# Patient Record
Sex: Male | Born: 1935 | Race: White | Hispanic: No | Marital: Married | Smoking: Former smoker
Health system: Southern US, Community
[De-identification: ages and names within clinical notes are randomized; demographics above are authoritative.]

## PROBLEM LIST (undated history)

## (undated) DIAGNOSIS — I350 Nonrheumatic aortic (valve) stenosis: Secondary | ICD-10-CM

## (undated) DIAGNOSIS — I2510 Atherosclerotic heart disease of native coronary artery without angina pectoris: Secondary | ICD-10-CM

## (undated) DIAGNOSIS — IMO0001 Reserved for inherently not codable concepts without codable children: Secondary | ICD-10-CM

## (undated) DIAGNOSIS — I10 Essential (primary) hypertension: Secondary | ICD-10-CM

## (undated) DIAGNOSIS — Z87442 Personal history of urinary calculi: Secondary | ICD-10-CM

## (undated) DIAGNOSIS — Z531 Procedure and treatment not carried out because of patient's decision for reasons of belief and group pressure: Secondary | ICD-10-CM

## (undated) DIAGNOSIS — D638 Anemia in other chronic diseases classified elsewhere: Secondary | ICD-10-CM

## (undated) DIAGNOSIS — I4510 Unspecified right bundle-branch block: Secondary | ICD-10-CM

## (undated) DIAGNOSIS — Z950 Presence of cardiac pacemaker: Secondary | ICD-10-CM

## (undated) HISTORY — DX: Reserved for inherently not codable concepts without codable children: IMO0001

## (undated) HISTORY — DX: Nonrheumatic aortic (valve) stenosis: I35.0

## (undated) HISTORY — DX: Unspecified right bundle-branch block: I45.10

## (undated) HISTORY — DX: Essential (primary) hypertension: I10

## (undated) HISTORY — DX: Presence of cardiac pacemaker: Z95.0

## (undated) HISTORY — DX: Procedure and treatment not carried out because of patient's decision for reasons of belief and group pressure: Z53.1

## (undated) HISTORY — DX: Anemia in other chronic diseases classified elsewhere: D63.8

## (undated) HISTORY — DX: Personal history of urinary calculi: Z87.442

## (undated) HISTORY — DX: Atherosclerotic heart disease of native coronary artery without angina pectoris: I25.10

---

## 1963-12-24 HISTORY — PX: DENTAL RESTORATION/EXTRACTION WITH X-RAY: SHX5796

## 1991-12-24 HISTORY — PX: CHOLECYSTECTOMY: SHX55

## 2002-12-23 DIAGNOSIS — I2510 Atherosclerotic heart disease of native coronary artery without angina pectoris: Secondary | ICD-10-CM

## 2002-12-23 DIAGNOSIS — I350 Nonrheumatic aortic (valve) stenosis: Secondary | ICD-10-CM

## 2002-12-23 HISTORY — DX: Atherosclerotic heart disease of native coronary artery without angina pectoris: I25.10

## 2002-12-23 HISTORY — PX: AORTIC VALVE REPLACEMENT: SHX41

## 2002-12-23 HISTORY — PX: CORONARY ARTERY BYPASS GRAFT: SHX141

## 2002-12-23 HISTORY — DX: Nonrheumatic aortic (valve) stenosis: I35.0

## 2003-08-26 ENCOUNTER — Encounter: Payer: Self-pay | Admitting: Cardiology

## 2003-08-26 ENCOUNTER — Inpatient Hospital Stay (HOSPITAL_COMMUNITY): Admission: EM | Admit: 2003-08-26 | Discharge: 2003-09-02 | Payer: Self-pay | Admitting: Cardiology

## 2003-08-27 ENCOUNTER — Encounter: Payer: Self-pay | Admitting: Cardiology

## 2003-08-31 ENCOUNTER — Encounter (INDEPENDENT_AMBULATORY_CARE_PROVIDER_SITE_OTHER): Payer: Self-pay | Admitting: Cardiology

## 2003-09-22 ENCOUNTER — Encounter: Payer: Self-pay | Admitting: Cardiothoracic Surgery

## 2003-09-22 ENCOUNTER — Ambulatory Visit (HOSPITAL_COMMUNITY): Admission: RE | Admit: 2003-09-22 | Discharge: 2003-09-22 | Payer: Self-pay | Admitting: Cardiothoracic Surgery

## 2003-09-26 ENCOUNTER — Encounter: Payer: Self-pay | Admitting: Cardiothoracic Surgery

## 2003-09-26 ENCOUNTER — Inpatient Hospital Stay (HOSPITAL_COMMUNITY): Admission: RE | Admit: 2003-09-26 | Discharge: 2003-10-05 | Payer: Self-pay | Admitting: Cardiothoracic Surgery

## 2003-09-26 ENCOUNTER — Encounter (INDEPENDENT_AMBULATORY_CARE_PROVIDER_SITE_OTHER): Payer: Self-pay | Admitting: *Deleted

## 2003-09-27 ENCOUNTER — Encounter: Payer: Self-pay | Admitting: Cardiothoracic Surgery

## 2003-09-28 ENCOUNTER — Encounter: Payer: Self-pay | Admitting: Cardiothoracic Surgery

## 2003-09-29 ENCOUNTER — Encounter: Payer: Self-pay | Admitting: Cardiothoracic Surgery

## 2003-10-21 ENCOUNTER — Encounter: Admission: RE | Admit: 2003-10-21 | Discharge: 2003-10-21 | Payer: Self-pay | Admitting: Cardiothoracic Surgery

## 2005-12-23 HISTORY — PX: CORONARY ANGIOPLASTY WITH STENT PLACEMENT: SHX49

## 2005-12-23 HISTORY — PX: PACEMAKER INSERTION: SHX728

## 2006-05-31 ENCOUNTER — Inpatient Hospital Stay (HOSPITAL_COMMUNITY): Admission: EM | Admit: 2006-05-31 | Discharge: 2006-06-04 | Payer: Self-pay | Admitting: Cardiology

## 2006-06-02 ENCOUNTER — Encounter (INDEPENDENT_AMBULATORY_CARE_PROVIDER_SITE_OTHER): Payer: Self-pay | Admitting: *Deleted

## 2006-07-09 ENCOUNTER — Inpatient Hospital Stay (HOSPITAL_COMMUNITY): Admission: AD | Admit: 2006-07-09 | Discharge: 2006-07-14 | Payer: Self-pay | Admitting: Cardiovascular Disease

## 2006-07-21 ENCOUNTER — Inpatient Hospital Stay (HOSPITAL_COMMUNITY): Admission: AD | Admit: 2006-07-21 | Discharge: 2006-07-22 | Payer: Self-pay | Admitting: *Deleted

## 2006-09-18 ENCOUNTER — Inpatient Hospital Stay (HOSPITAL_COMMUNITY): Admission: AD | Admit: 2006-09-18 | Discharge: 2006-09-19 | Payer: Self-pay | Admitting: Cardiovascular Disease

## 2008-04-09 ENCOUNTER — Inpatient Hospital Stay (HOSPITAL_COMMUNITY): Admission: EM | Admit: 2008-04-09 | Discharge: 2008-04-10 | Payer: Self-pay | Admitting: Cardiovascular Disease

## 2008-08-24 IMAGING — CR DG CHEST 2V
2 series · 2 of 2 positions shown · non-contrast
Comparison: 07/22/2006

CLINICAL DATA: Chest pain

CHEST - 2 VIEW

[w chest pa]
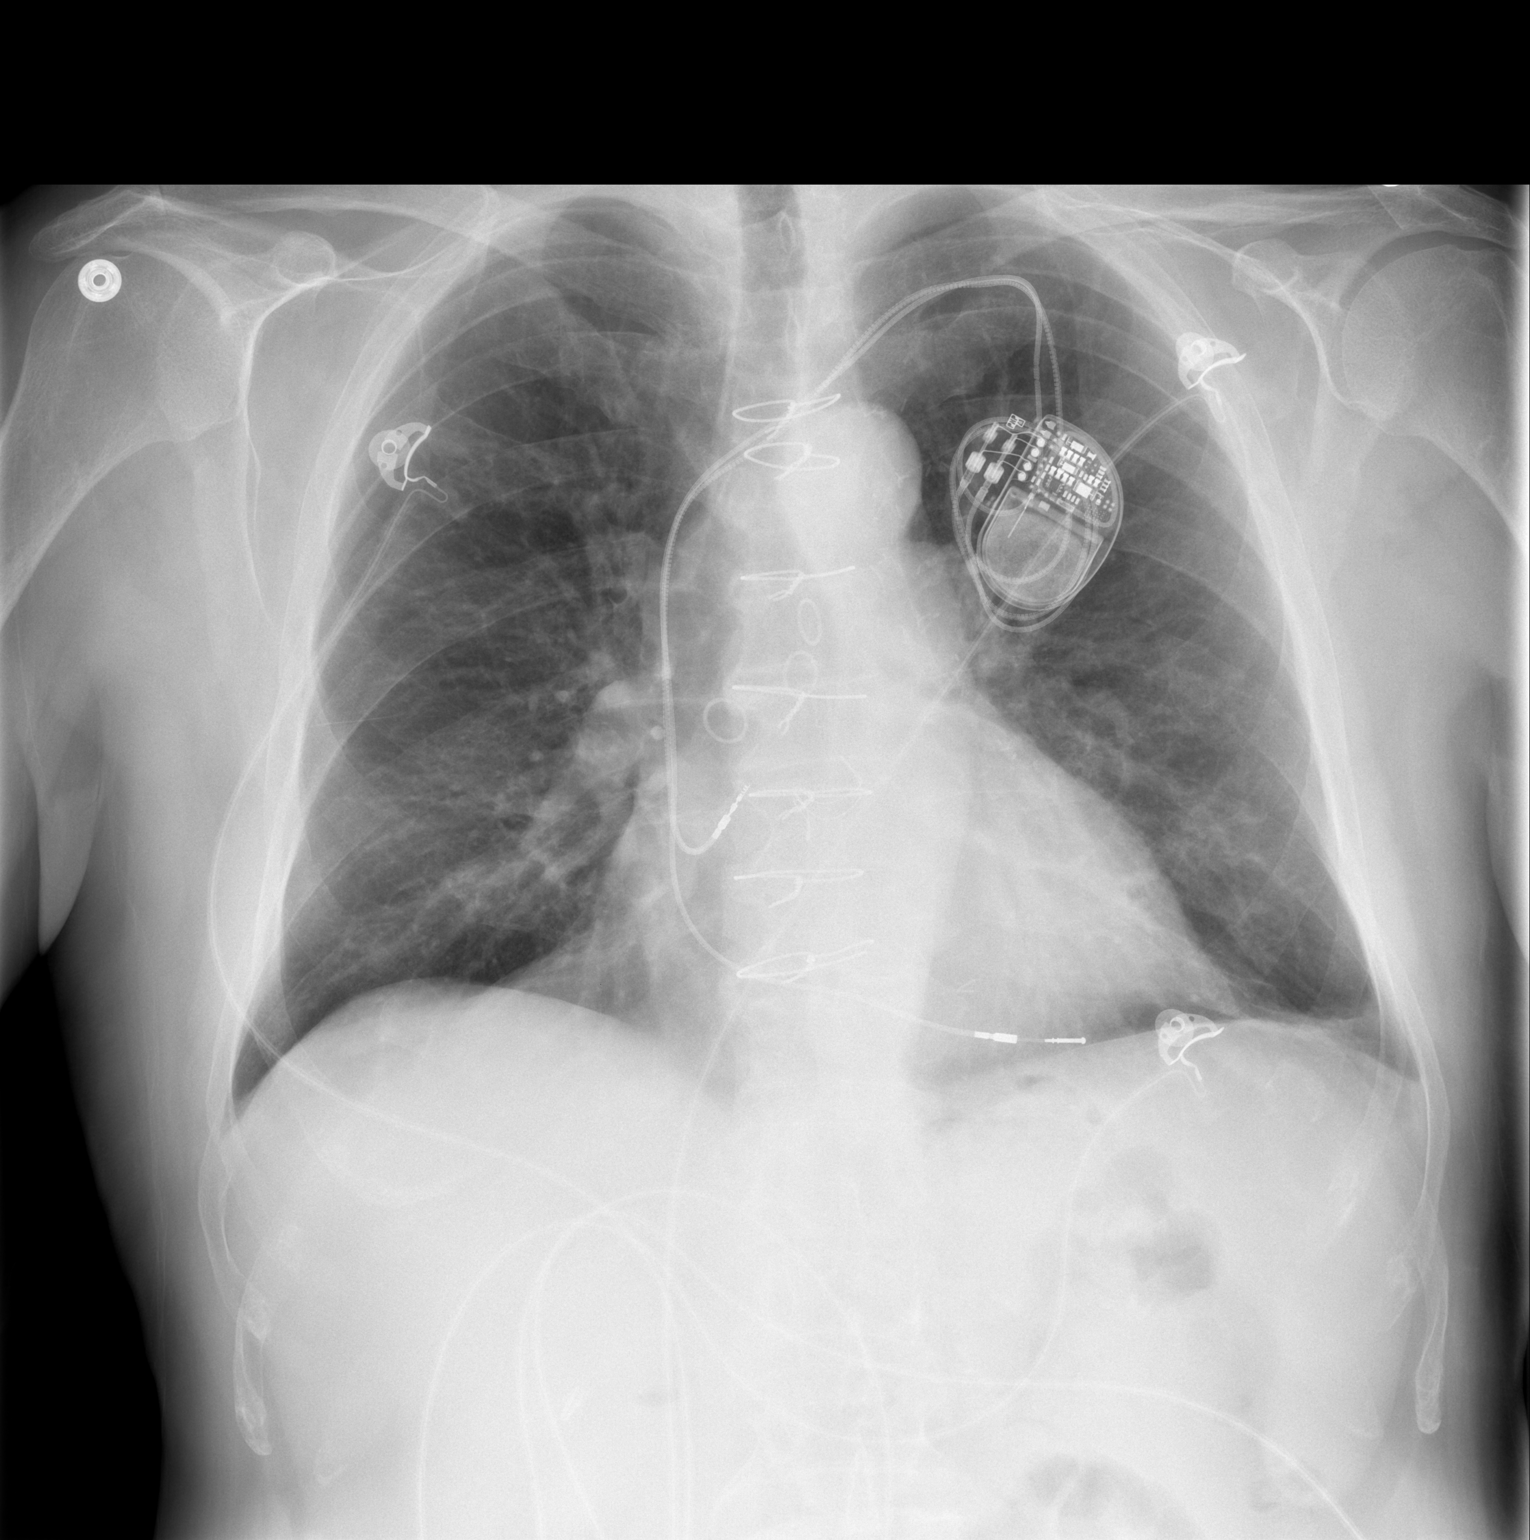

[w chest lat]
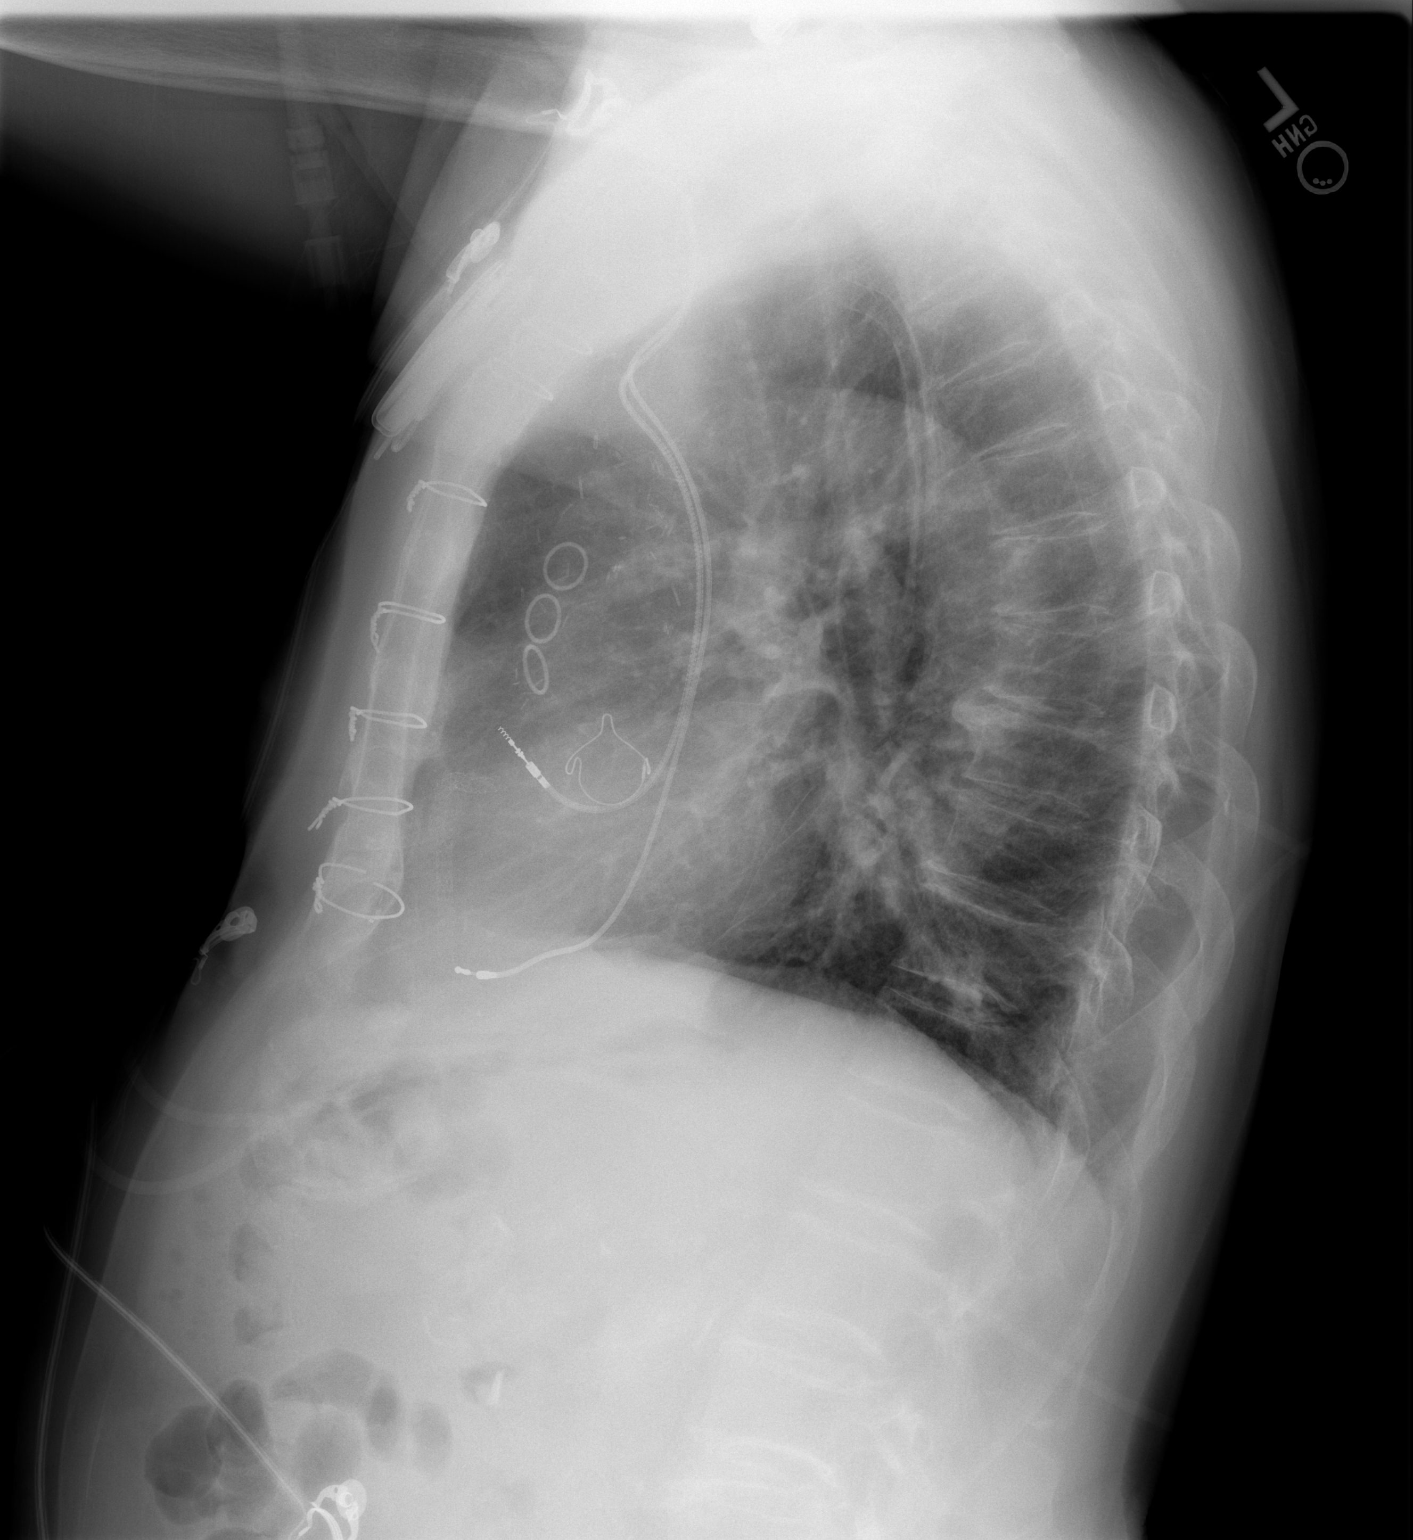

[2 of 2 positions shown; findings below may reference images not displayed]

FINDINGS: Cardiac pacer remains in satisfactory position.
Pulmonary interstitial densities are chronic.  Negative for
infiltrates edema or effusions.  Degenerative changes mid thoracic
spine
IMPRESSION: Stable chest

## 2011-05-10 NOTE — Discharge Summary (Signed)
NAMELYNNWOOD, BECKFORD NO.:  [id number]   MEDICAL RECORD NO.:  [id number]          PATIENT TYPE:  INP   LOCATION:  4703                         FACILITY:  [facility]   PHYSICIAN:  Nanetta Batty, M.D.   DATE OF BIRTH:  1924-02-09   DATE OF ADMISSION:  07/09/2006  DATE OF DISCHARGE:  07/14/2006                                 DISCHARGE SUMMARY   DISCHARGE DIAGNOSES:  1. Recurrent and stable angina.  2. Significant coronary artery disease with history of bypass grafting and      now occluded vein graft to the RCA, LIMA with a 95% ostial stenosis      failing medical therapy.  3. Failure of EECP treatment secondary to trauma of lower extremities.  4. Chronic right bundle-branch block.  5. Bradycardia with medication, beta-blocker and pain medication.  6. Hyperlipidemia.  7. Paroxysmal atrial fibrillation.  8. Hypertension.  9. Jehovah Witness refusing blood products.   DISCHARGE CONDITION:  Stable.   DISCHARGE MEDICATIONS:  1. Aspirin 325 mg daily.  2. Prilosec 20 mg daily.  3. Imdur 60 mg 1-1/2 tablets daily to equal 90 mg.  4. Lescol XL 80 mg daily.  5. Metoprolol 25 b.i.d.  6. Norvasc 10 mg daily.  7. Hyzaar 100/12.5 daily instead of the Cozaar.  8. Nitroglycerin as needed sublingual.  9. Darvocet-N 100 one to two every six hours as needed.  10.Stop furosemide and potassium.   DISCHARGE INSTRUCTIONS:  1. Low fat and low-salt diet.  2. Increase activity slowly.  3. See Dr. Alanda Amass July 22, 2006 at noon.   HISTORY OF PRESENT ILLNESS:  A 77 year old white married male patient of Dr.  Kandis Cocking was admitted to the hospital July 09, 2006 by Dr. Allyson Sabal after  presenting to [facility] with chest pain.   His history is complex and he has had history of coronary disease and  valvular heart disease with bypass grafting October of 2004 by Dr. Kathlee Nations  Trigt with a bovine aortic valve placed as well.  He also has hypertension,  dyslipidemia and statin  intolerance as well as PAF.  He is a Jehovah  Witness.   In June of this year, he was admitted with chest pain and subendocardial MI.  Cardiac cath revealed occluded vein graft to the RCA with a recannulized  proximal dominant branch.  His LIMA went to a small diagonal which had a 95%  ostial stenosis in the LAD with bypass.  He had a vein graft to the diagonal  OM.  Medical therapy was deemed appropriate at that time, he was discharged  home and began EECP treatments and he had two weeks of this when they were  discontinued because of trauma to his lower extremities.  On July 04, 2006,  he was admitted to [facility] with chest pain.  His enzymes  were negative, though they were trending upward.  He was transferred to [facility]  at that point for further eval.   OUTPATIENT MEDS:  1. Lopressor 25 b.i.d.  2. Aspirin 81.  3. Cozaar 100.  4. Isosorbide 60.  5. Lasix  40.  6. Potassium 10.   ALLERGIES:  AMIODARONE, KEFLEX AND PENICILLIN.   FAMILY HISTORY/SOCIAL HISTORY/ REVIEW OF SYSTEMS:  See H&P.   PHYSICAL EXAM AT DISCHARGE:  VITAL SIGNS:  Blood pressure initially had been  171/54 and then was 130/70, pulse 54.  GENERAL:  Alert oriented male.  LUNG:  Clear.  HEART:  Regular rate and rhythm.   LABORATORY DATA:  Hemoglobin 12.5, hematocrit 37.4, WBC 4.6, platelets 128.  At discharge, hemoglobin 11.6, hematocrit 33.8, WBC 3.9 and platelets 129.  Neutrophils 69, lymphs 42, monos 8, eo 1, baso 0.   Occult blood was negative.   Admit PT of 14.8, INR of 1.1.  On heparin, he was 0.73 for heparin level.   Chemistry:  Sodium 140, potassium 3.9, chloride 109, CO2 25, glucose 135,  BUN 16, creatinine 1.1, calcium 8.8, total protein 6.3, albumin 3.8, AST 28,  ALT 22, ALP 90, total bili was 3.5.  Glucose was only mildly elevated after  the initial admission at 100 to 101.   Cardiac enzymes were negative.  CKs were 53, 62 with the MBs 3.4 and 3.0,  but the troponin I was  elevated at 0.41 and 0.31.   TSH 1.144.  UA had a trace of hemoglobin, otherwise clear.   HOSPITAL COURSE:  Mr. Devine was admitted by Dr. Allyson Sabal after being  transferred from [facility] secondary to chest pain.  Initially, he  was still hypertensive and beta-blocker was increased, Lasix was  discontinued and patient was weaned off the nitroglycerin.  He has history  of intolerance to statins, but we are discharging him with Lescol to try  that.  Norvasc was also added for hypertension and angina.  He does have a  history of diastolic heart failure.  He was fairly stable until July 19, he  became bradycardic and had a decrease in consciousness and Dr. Jacinto Halim and the  rapid response team were called.  Patient's heart rate had dropped to 44,  morphine was discontinued and Darvocet was added instead.  Patient  eventually came around and had no further problems except the heart rate was  low.  His Lopressor was held and then restarted.  Patient did well.  Dr.  Cornelius Moras saw him and felt that CABG was not called for at this point and PTI  could be attempted, but it would be without surgical backup.  Patient  continued to improve with a stabilized heart rate, negative MI.   By July 23, he had no further chest pain.  Dr. Allyson Sabal discussed possibly  sending him to Dr. Lisabeth Register at [facility] for a second opinion.  He was going  to go home which he did and was to follow up with Dr. Alanda Amass in one week.      Darcella Gasman. Annie Paras, N.P.      Nanetta Batty, M.D.  Electronically Signed    LRI/MEDQ  D:  09/07/2006  T:  09/07/2006  Job:  [id number]   cc:   Kerin Perna, M.D.

## 2011-05-10 NOTE — Consult Note (Signed)
NAME:  [name], [name]                         ACCOUNT NO.:  [id number]   MEDICAL RECORD NO.:  [id number]                   PATIENT TYPE:  INP   LOCATION:  4707                                 FACILITY:  [facility]   PHYSICIAN:  Samul Dada, M.D.            DATE OF BIRTH:  06/07/1924   DATE OF CONSULTATION:  08/26/2003  DATE OF DISCHARGE:                                   CONSULTATION   REFERRING MEDICAL DOCTOR:  Dr. Dani Gobble.   HISTORY OF PRESENT ILLNESS:  [age]-year-old male, Jehovah's Witness,  asked to see in consultation for low platelet count.  He was admitted with  chest pain on August 25, 2003 in the [facility] ED.  He was diagnosed with  inferior wall MI and atrial fibrillation with rapid ventricular rate, now  converted to sinus rhythm.  He was transferred to [facility] for further  evaluation.  His platelets were 110,000 prior to transferring.  He was  started on heparin.  New labs on August 26, 2003 at [facility] revealed a  PTT of 47 and a PT of 15.3 with an INR of 1.3.  He was to have a planned  catheterization.  However, he mentioned that in 1995, after sedation, he was  given anti-thrombolytic treatment, and undergoing catheterization, he  experienced a large bleed, and almost died, (no records).  Per patient  report, he was evaluated by a hematologist in [place], Dr. Lambert Mody, two years ago  to follow up on his low platelet count versus MDS, but no bone marrow was  recommended at the time.  He failed to follow up.  He was seen again at the  emergency department by Dr. Lambert Mody, who raised the question of MDS versus  other blood dyscrasias, to be followed up and treated at [facility].  Before proceeding with catheterization, he was asked to be seen.   PAST MEDICAL HISTORY:  1. CAD, status post MI in 62 and 1995.  2. Hypertension.  3. History of positional vertigo.  4. History of hiatal hernia with stricture -- esophageal dysmotility.  5. Diabetes  mellitus, diet-controlled.  6. Hypercholesterolemia.  7. PAF in admission.   MEDICATIONS:  1. Hydrochlorothiazide 25 mg daily.  2. K-Dur 20 mEq daily.  3. Colace 200 mg daily.  4. Prilosec 20 mg daily.  5. Avapro 300 mg q.a.m.  6. Atenolol 50 mg q.a.m.  7. ECASA.   ALLERGIES:  LIPITOR, LOPID, BETA_______, PROCARDIA, PENICILLIN, VASOTEC,  MAXZIDE, AVAPRO, NIASPAN, CELEBREX and PRAVACHOL.   SURGERY:  1. Status post cholecystectomy.  2. Status post lithotripsy.  3. Status post cardiac catheterization, last one in 1995.   REVIEW OF SYSTEMS:  See HPI for significant positives.   FAMILY HISTORY:  Mother died of MI at 82; father died at 71 also of MI.  One  brother is alive with a history of blood problems at 72.  The patient's  brother does  not want to elaborate on what type of blood disease he has.   SOCIAL HISTORY:  The patient is married and has four children in good  health.  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  He denies any tobacco or alcohol  intake.  He is with his wife Liborio Nixon today, telephone number [phone], his  caretaker.   PHYSICAL EXAMINATION:  GENERAL:  [age]-year-old white male in no acute  distress, alert and oriented x3.  VITAL SIGNS:  Blood pressure 173/87, pulse 57, respirations 20, temperature  97.3, pulse oximetry 95% on 2 L.  HEENT:  Normocephalic, atraumatic.  PERRLA.  EOMI.  Oral mucosa intact.  NECK:  Neck supple.  No JVD, cervical or supraclavicular masses.  Bilateral  bruits audible.  CHEST:  Chest symmetrical on inspiration with no wheezes or rhonchi.  There  is a trace of rales audible.  AXILLAE:  No axillary masses.  CARDIOVASCULAR:  Regular rate and rhythm with a 2/6 systolic murmur.  No  rubs or gallops.  ABDOMEN:  Abdomen soft, nontender.  Bowel sounds x4.  No palpable spleen or  liver.  GU AND RECTAL:  Deferred.  EXTREMITIES:  No clubbing, cyanosis, or edema.  SKIN:  Skin with the presence of an area of ecchymosis or bruise in the   right side of his back, the patient denies any trauma to the area.  NEUROLOGIC:  Nonfocal.   LABORATORIES:  Hemoglobin 11.8, hematocrit 33.8, WBC 5.7, platelets 98,000;  normal differential; MCV 87.9, MCH 30.  PT 15.3, INR 1.3, PTT 47.  BNP 553.  CK positive.  Sodium 138, potassium 4.2, BUN 12, creatinine 0.9, glucose 95,  calcium 8.3.   ASSESSMENT AND PLAN:  Because the patient had a severe life-threatening  hemorrhage after cardiac catheterization in 1995 in [place], [place], his  condition poses the following problems:   1. Jehovah's Witness:  The patient will not accept major primary blood     components, whole blood, any cellular products or plasma.  If he has a     major bleed, a pack of red blood cells or platelets are out.  Products,     for example, intravenous immunoglobulin and activated factor VII, might     be acceptable; the family to confirm.  2. The patient's mild anemia and thrombocytopenia are of uncertain etiology.     It could be myelodysplastic syndromes, hypersplenism, or medicine     related.  It is unlikely that the patient has a bleeding disorder, even     with a platelet count of 100,000, but we will check for von Willebrand's     disease and active bleeding time (the patient is on aspirin).  3. Recent laboratories with apparent decreasing hemoglobin and hematocrit     are confusing.  We will look for signs of hemolysis, gastrointestinal     bleed and retroperitoneal hematoma (right flank ecchymosis).  4. The decision regarding the safety of aspirin, Plavix, heparin and/or     IIb/IIIa inhibitors is complex in this patient, being a Jehovah's     Witness; the risks of bleeding from these therapeutic agents are clearly     increased in otherwise healthy patients.  This patient's risk may be     increased due to the history of major bleed in 1995.  If this patient has     a major bleed, we have no backup plan, for example, packed red blood    cells, platelet  treatment, fresh frozen plasma, et Karie Soda.  Dr. Arline Asp     personally would be most cautious about the risk here and would encourage     the     patient and family to make decisions regarding his therapy, once the risk     has been explained to them.  Dr. Arline Asp and his partners will be     available over the weekend.   Thank you very much for the opportunity to see Mr. Dennington.      Marlowe Kays, P.A.                        Samul Dada, M.D.    SW/MEDQ  D:  08/30/2003  T:  08/30/2003  Job:  [id number]

## 2011-05-10 NOTE — Discharge Summary (Signed)
[name], [name] NO.:  [id number]   MEDICAL RECORD NO.:  [id number]          PATIENT TYPE:  INP   LOCATION:  4741                         FACILITY:  [facility]   PHYSICIAN:  Larry Iba, MD   DATE OF BIRTH:  25-Mar-1924   DATE OF ADMISSION:  04/09/2008  DATE OF DISCHARGE:  04/10/2008                               DISCHARGE SUMMARY   HISTORY OF PRESENT ILLNESS:  [name] is an 77 year old male patient  with known coronary disease, status post CABG in 2004.  His last cath  was September 2007.  He had patent LIMA to his LAD, SVG to his  intermediate, and SVG to his proximal diagonal.  He had an occluded vein  graft to his RCA.  He had stenting of his RCA with three tandem Cypher  stents.  He also had PAF, sick sinus syndrome status post pacemaker,  hypertension, and dyslipidemia.  He apparently went to [employer] in  [place] and without any difficulty in the morning; however, in the  evening, he had substernal chest pain and tightness, and pain down in  his left arm to his fingers.  He took a sublingual nitroglycerin without  relief, and he activated EMS.  He was taken to [facility], there  he had normal sinus rhythm with a right bundle-branch block which is  chronic.  Cardiac enzymes were negative x1.  His BNP was mildly  elevated.  The wife had noted a 2-pound weight gain, and he was referred  to [facility].  He was placed on heparin and nitroglycerin, and was  feeling much better.  He was admitted.  CK-MBs and troponins were  elevated.  The nitroglycerin was discontinued because he was pain free.  It was decided if his third set of enzymes were negative, they could  discontinue his heparin and relieve him and consider outpatient stress  versus a cath versus just follow up with [name].  His Cozaar was  decreased to 50 mg, and he was started on Imdur 15 mg a day.  It was  felt that his pain seemed to be more musculoskeletal.  He was having  a  nerve type pain like hitting a funny bone.  He had no further chest  tightness.  He was seen by [name] the following day, who did  not think this was ischemic related, and it was decided that he should  be discharged home.  He was ambulatory in the hall.  He had no further  complaints, and he was discharged.   DISCHARGE MEDICATIONS:  1. Omeprazole 20 mg daily.  2. Plavix 75 mg a day.  3. Aspirin 81 mg a day.  4. Metoprolol 100 mg b.i.d.  5. Cozaar was decreased to 50 mg a day.  6. Fish oil 1 tab every day.  7. Cardura 2 mg at bedtime.  8. Spironolactone 25 mg every day.  9. He was discharged on Imdur 30 mg a half every day.   LABORATORY DATA:  Hemoglobin was 11.3, hematocrit 32.5, WBCs 4.4, and  platelets 107.  Sodium 140, potassium  4.1, glucose 107, BUN 21, and  creatinine 1.08.  CK-MBs and troponins were negative x3.  Total  cholesterol was 152, triglycerides were 80, HDL was 27, and LDL was 109.  TSH was 2.397.   DISCHARGE DIAGNOSES:  1. Chest pain with negative CK-MBs and troponins, not thought to be      ischemic related, more musculoskeletal complaints.  2. Left lower arm pain, musculoskeletal or nerve.  3. Known coronary artery disease with coronary artery bypass graft and      stenting to his native right coronary artery in 2007.  Other grafts      were patent.  He had a total saphenous vein graft to his right      coronary artery.  4. Permanent pacemaker.  5. Pericardial aortic valve replacement.  6. Hypertension.  7. Hyperlipidemia.  8. History of hiatal hernia.  9. Esophageal stricture.  10.History of renal stents for nephrolithiasis.  11.Ejection fraction of 45%-50% in __________ 2008.  12.Chronic right bundle-branch block.      [name], N.P.      Larry Iba, MD  Electronically Signed    BB/MEDQ  D:  05/04/2008  T:  05/05/2008  Job:  [id number]   cc:   [name]

## 2011-05-10 NOTE — Op Note (Signed)
NAME:  [name], [name]                         ACCOUNT NO.:  [id number]   MEDICAL RECORD NO.:  [id number]                   PATIENT TYPE:  INP   LOCATION:  2304                                 FACILITY:  [facility]   PHYSICIAN:  Sheldon Silvan, M.D.                   DATE OF BIRTH:  21-May-1924   DATE OF PROCEDURE:  09/26/2003  DATE OF DISCHARGE:                                 OPERATIVE REPORT   PROCEDURE:  Intraoperative transesophageal echocardiography (TEE).   [name] was brought to the operating room by Kerin Perna, M.D.,  today for replacement of a stenotic aortic valve and coronary artery bypass  grafting.  It was felt that use of TEE as both a monitoring and diagnostic  tool would be appropriate for him in this situation.   After placement of appropriate invasive monitoring he was taken to the  operating room.  He was anesthetized and intubated easily.  At this point  the [employer] OmniPlane TEE probe was passed through the oropharynx  after being appropriately lubricated and covered with a sheath.  It was  passed after gentle pressure into the esophagus on one pass.  The patient  had a history of esophageal stricture that had been dilated one year ago.  He has had no problem from the esophagus since then.   The heart was imaged and the exam was begun at the left ventricle.  It was  noted to be mobile in all wall segments and was moving well.  It was mildly  thickened concentrically.   The mitral valve was examined and the chordae were intact.  The anterior and  posterior leaflets appeared to coapt well.  There was 1-2+ mitral  regurgitation noted on color-flow exam.   The left atrial image was imaged and seen to be free of thrombus.   The interatrial septum was imaged and an interrogation with color flow was  performed, and there was no patent foramen ovale noted.  The right ventricle  appeared normal and the tricuspid valve functioned well with only trace to  1+ regurgitation on color flow exam.   The aortic valve was seen and in the en face view, the sclerotic edges of a  tricuspid valve were seen.  In the long axis view, there was stenosis noted  as well as 2+ regurgitation through the valve.   The patient was placed on cardiopulmonary bypass and Dr. Donata Clay performed  coronary artery bypass grafting as well as an aortic valve replacement with  a tissue valve.   After completion of the bypass period, the heart was filled and maneuvers  were carried out to remove air that was noted on TEE.  The aortic valve was  examined and was functioning well.  It was in good position.  There was no  regurgitation seen on the long axis view and only mild stenosis.   The  left ventricle was imaged again and there was no change in its  contractility.  There were no segments of abnormal wall motion noted.  The  mitral valve remained at approximately 1+ regurgitation on color-flow exam.  The probe was carefully removed from the patient prior to transportation to  the SICU.                                               Sheldon Silvan, M.D.    DC/MEDQ  D:  09/26/2003  T:  09/27/2003  Job:  [id number]   cc:   Anesthesia Department

## 2011-05-10 NOTE — H&P (Signed)
NAMEJAGGER, DEMONTE NO.:  [id number]   MEDICAL RECORD NO.:  [id number]          PATIENT TYPE:  INP   LOCATION:  2109                         FACILITY:  [facility]   PHYSICIAN:  Madaline Savage, M.D.DATE OF BIRTH:  [birth year]   DATE OF ADMISSION:  05/31/2006  DATE OF DISCHARGE:                                HISTORY & PHYSICAL   CHIEF COMPLAINT:  Chest pain.   HISTORY OF PRESENT ILLNESS:  Mr. Closs is a 77 year old Jehovah's Witness  who has had a prior history of coronary disease.  He had an MI in 1978.  He  had another MI in 1995.  Ultimately he had bypass surgery x4 in September  2004 with a tissue AVR.  He had presented September 1, 2004m with an MI with  AF.  The patient was put on amiodarone after his surgery for PAF but was  apparently unable to tolerate this and stopped it as an outpatient.  He saw  [place] back once after his bypass in 2004 but we have not seen him since.  He is  done well from a cardiac standpoint until last evening.  Last evening he was  at a restaurant and had eaten dinner when he developed chest pain with  diaphoresis.  He went to the emergency room in [place], where he was noted  to be in rapid AF.  He was started on Cardizem and converted spontaneously  to sinus rhythm.  He is transferred to [facility] for further evaluation as his  enzymes there were positive.  His second enzyme here was 495 with  36 MBs.  His troponin is 10.  He is now in sinus rhythm.  He has low-grade chest  discomfort.   PAST MEDICAL HISTORY:  1.  Hypertension.  2.  He has a hiatal hernia and esophageal stricture.  3.  He has a history of dyslipidemia but is not on a statin.  4.  An old records shown to be a non-insulin diabetic, diet-controlled.  5.  He had a renal stent placed a last year for nephrolithiasis.   CURRENT MEDICATIONS:  1.  Prilosec 20 mg a day.  2.  Lasix 40 mg in the morning 20 mg in the evening.  3.  Lopressor 25 mg b.i.d.  4.  Cozaar 100 mg a  day.  5.  Potassium 10 mEq a day.  6.  Aspirin 81 mg a day.   He is allergic to PENICILLIN.  He says he cannot take KEFLEX or AMIODARONE  either.   SOCIAL HISTORY:  He is a nonsmoker.  He is a Scientist, product/process development.  He has 4  children and multiple grandchildren and great-grandchildren.  He lives in  [place].   FAMILY HISTORY:  Unremarkable.   REVIEW OF SYSTEMS:  Essentially unremarkable except for noted above.  He  denies any GI bleeding or melena.   PHYSICAL EXAMINATION:  VITAL SIGNS:  Blood pressure 160/62 with a pulse of  62, respirations 12.  GENERAL:  He is a well-developed, well-nourished male in no acute distress.  HEENT: Normocephalic, atraumatic.  Extraocular movements were intact.  Sclerae are nonicteric.  He does were glasses.  NECK:  Without JVD.  Has a transmitted murmur in both carotids.  CHEST:  Clear to auscultation and percussion.  CARDIAC:  Regular rate and rhythm with a 2/6 systolic murmur at left sternal  border and aortic valve area.  ABDOMEN:  Nontender.  No hepatosplenomegaly.  EXTREMITIES:  Without edema.  Distal pulses are intact.  There are no  femoral bruits.  NEUROLOGIC:  Grossly intact.  He is awake, alert, oriented and cooperative,  moves all extremities without obvious deficit.  SKIN:  Warm and dry.   EKG reveals sinus rhythm with right bundle branch block, now with inferior Q-  waves.  Labs show a white count 6.6, hemoglobin 11.7, hematocrit 33.4,  platelets 127.  Sodium 141, potassium 4.2, BUN 17, creatinine 1.0.  CK  peaked at 495, 36 MB, troponin at 10.4.  His SGOT is 50, SGPT 18.  Chest x-  ray shows cardiomegaly, no active.   IMPRESSION:  1.  Subendocardial myocardial infarction.  2.  Recurrent paroxysmal atrial fibrillation.  3.  Coronary artery bypass grafting October 2004 with tissue aortic valve      replacement.  4.  Hypertension.  5.  History of hiatal hernia with esophageal stricture.  6.  Past history of hyperlipidemia.  7.   Past history of diet-controlled diabetes.   PLAN:  The patient seen by Dr. Tresa Endo and myself today.  He will be admitted  to the CCU, he is kept on heparin and will start low-dose nitrates.  He will  probably need restudy Monday.  At some point will need to discuss Coumadin  therapy.      Abelino Derrick, P.A.    ______________________________  Madaline Savage, M.D.    Lenard Lance  D:  05/31/2006  T:  05/31/2006  Job:  [id number]

## 2011-05-10 NOTE — Op Note (Signed)
NAMEKELII, CHITTUM NO.:  [id number]   MEDICAL RECORD NO.:  [id number]          PATIENT TYPE:  INP   LOCATION:  3714                         FACILITY:  [facility]   PHYSICIAN:  Darlin Priestly, MD  DATE OF BIRTH:  [birth year]   DATE OF PROCEDURE:  07/21/2006  DATE OF DISCHARGE:                                 OPERATIVE REPORT   PROCEDURES:  Insertion of a Medtronic EnRhythm generator, model number  P1501DR, serial number [id number], with passive ventricular and active  atrial leads.   ATTENDING:  Darlin Priestly, MD   COMPLICATIONS:  None.   INDICATIONS:  Mr. Faison is a 77 year old male patient of Dr. Franchot Heidelberg, Dr. Dara Hoyer, with a history of CAD status post coronary  artery bypass surgery, as well as pericardial aortic valve replacement in  October 2004, history of paroxysmal atrial fibrillation, history of  pancytopenia, history of sick sinus syndrome with a recent syncopal episode.  He is now referred for dual-chamber pacer implant secondary to sick sinus  syndrome and to allow adequate treatment of his CAD.   DESCRIPTION OF OPERATION:  After informed consent, the patient brought to  the cardiac cath lab, where left chest was shaved, prepped and draped in a  sterile fashion.  ECG monitor was established.  Lidocaine 1% was then used  to anesthetize the mid left subclavicular area.  Next, approximately a 3-cm  horizontal mid infraclavicular incision was then carried out, and hemostasis  was obtained with electrocautery.  Blunt dissection was used to carry this  down to the left pectoral fascia.  Next, an approximately 3 x 4-cm pocket  was then created over the left pectoral fascia, and again hemostasis was  obtained with electrocautery.  Following this, the left subclavian vein was  then easily entered, and a guidewire was then passed into the SVC and right  atrium.  A 2nd guidewire was then easily introduced into the left subclavian  vein.   Over the 1st retained guidewire, a 7-French dilator and sheath were  then easily tracked, and the dilator and guidewire were removed.  Through  this, a 58-cm passive Medtronic lead, model [id number], serial number [id number]  [id number], was then passed into the right atrium.  The peel-away sheath was removed.  A 2nd 7-French dilator and sheath were then inserted over the 2nd retained  guidewire, and the guidewire and dilator were removed.  Following this, a 52-  cm active Medtronic lead, model #5076, serial number [id number], was then  passed into the right atrium.  Peel-away sheath was removed.  A J-curve was  then placed in the ventricular lead stylet, and the ventricular lead was  then allowed to prolapse the tricuspid valve and positioned in the RV apex  without difficulty.  Thresholds were determined.  R waves were measured at  10.3 millivolts.  Impedance is 473 ohms.  Thresholds 0.3 volts at 0.5  milliseconds.  Current is 0.6 milliamps.  Ten volts were negative for  diaphragmatic stimulation.  This lead was then secured in place with 2 silk  sutures, which were anchored to  the pectoral fascia.  A preformed J-stylet  was then inserted into the atrial lead and the atrial lead positioned in the  area of the right atrial appendage.  We were able to capture at 2 volts, and  the screw was then extended.  Thresholds were determined.  P waves were  measured at 3.2 millivolts.  Impedance 526 ohms.  Thresholds 0.7 volts at  0.5 milliseconds.  Current is 1.4 milliamps.  Again, 10 volts were negative  for diaphragmatic stimulation.  There was a good current of injury noted.  This lead was then secured to the left pectoral fascia, again with 2 silk  sutures.  The pocket was then copiously irrigated with 1% gentamicin  solution.  The leads were then connected in a serial fashion to a Medtronic  EnRhythm generator, model #P1501DR, serial number [id number].  Head screws  were tightened, and pacing was  confirmed.  A single silk suture was then  placed in the apex of the pocket.  The generator leads were then delivered  into the pocket, and the header was secured to the silk suture.  The  subcutaneous skin layers were then closed with running 2-0 Vicryl.  The skin  layer was then closed with running 4-0 Vicryl.  Steri-Strips were then  applied.  The patient returned to the recovery room in stable condition.   CONCLUSIONS:  Successful implant of a Medtronic EnRhythm [id number] generator,  serial number [id number], with passive ventricular and active atrial  leads.      Darlin Priestly, MD  Electronically Signed     RHM/MEDQ  D:  07/21/2006  T:  07/22/2006  Job:  [id number]   cc:   Gerlene Burdock A. Alanda Amass, M.D.  Teena Irani. Arlyce Dice, M.D.

## 2011-05-10 NOTE — Discharge Summary (Signed)
[name], [name] NO.:  [id number]   MEDICAL RECORD NO.:  [id number]          PATIENT TYPE:  INP   LOCATION:  3714                         FACILITY:  [facility]   PHYSICIAN:  [name], P.A. DATE OF BIRTH:  05/07/1924   DATE OF ADMISSION:  07/21/2006  DATE OF DISCHARGE:  07/22/2006                                 DISCHARGE SUMMARY   DISCHARGE DIAGNOSES:  1.  Symptomatic bradycardia with syncope, status post permanent transvenous      pacemaker implanted by Dr. Jenne Mosley.  2.  Hypertension.  3.  Known coronary artery disease.  Patient has had a recent catheterization      in June of 2007.  Might need future RCA intervention.  4.  Status post coronary artery bypass grafting in 2004.  5.  Hyperlipidemia with intolerance to statins secondary to myalgia.   HOSPITAL PROCEDURES:  Permanent pacemaker was implanted by Dr. Jenne Mosley on  July 21, 2006.  It was a Medtronic device with serial number [id number].  Atrial lead serial number [id number].  Ventricular lead serial number  [id number].  The patient tolerated procedure well.   HPI/HOSPITAL COURSE:  This is a 77 year old gentleman patient of Dr.  Alanda Mosley with known prior history of coronary artery disease who recently  had a episode of frank syncope with bradycardia and hypertension.  Patient  was evaluated by [name] and scheduled for a pacer placement on July 21, 2006, so for this procedure, he was admitted to the surgery unit.  Procedure was performed on the same day by Dr. Jenne Mosley without any immediate  or distant complications.  The next morning his pacer was interrogated by  pacer representative, it revealed all normal parameters and no changes to  the system were done.  [name] evaluated the patient in the morning  and told that he could be discharged home and his x-ray revealed no  complications.  The chest x-ray was performed in the morning of the  patient's discharge; it revealed no  pneumothorax, no edema.  __________  ventricular leads were in good position and no acute findings were noted on  x-ray.   The patient is being discharged home on the following medications:  1.  Lopressor 25 mg b.i.d.  2.  Norvasc 10 mg q.d.  3.  Aspirin 81 mg two pills q.d.  4.  Imdur 60 mg 1-1/2 p.o. q.d.  5.  Omeprazole 20 mg q.d.  6.  Lescol 80 mg q.d.  7.  Hyzaar 100/12.5 mg q.d.   DISCHARGE INSTRUCTIONS:  The patient is to stay on a low fat, low-  cholesterol diet.  He was not allowed or drive or lift any heavy weight for  a week.  He was instructed not to engage in any vigorous physical activity.  Avoid any strong movements with left hand and progress with activity slowly.  He was instructed not to wet pacer incision site for a week after placement  of the pacer.  Patient will be seen by [name] on August 7th at 11:30  a.m.      [place], [place].A.  MK/MEDQ  D:  07/22/2006  T:  07/22/2006  Job:  [id number]   cc:   [facility]  Larry Irani. Arlyce Mosley, M.D.

## 2011-05-10 NOTE — Discharge Summary (Signed)
[name], [name]               ACCOUNT NO.:  [id number]   MEDICAL RECORD NO.:  [id number]          PATIENT TYPE:  INP   LOCATION:  6522                         FACILITY:  [facility]   PHYSICIAN:  Richard A. Alanda Amass, M.D.DATE OF BIRTH:  01/04/1924   DATE OF ADMISSION:  09/18/2006  DATE OF DISCHARGE:  09/19/2006                                 DISCHARGE SUMMARY   DISCHARGE DIAGNOSES:  1. Unstable angina, elective RCA Cypher stenting this admission.  2. History of coronary artery disease, coronary artery bypass grafting in      2004 with hospitalization for paroxysmal atrial fibrillation and sick      sinus syndrome, status post  __________ .  3. Treated dyslipidemia.  4. Treated hypertension.   HOSPITAL COURSE:  [name] is a 77 year old male known to Dr. Alanda Amass.  He lives in [place]. He had bypass surgery in 2004. He was admitted June  2007 and had a cath showing patent grafts with a high grade native RCA  disease. The SVG to the PDA was occluded. He was not a candidate for redo  bypass and was treated medically. He presented in July 2007 with syncope and  underwent a pacemaker.  Dr. Alanda Amass saw him in the office on September 12, 2006. His angiograms  were reviewed. He has had an intercurrent event with presyncopal spell  August 26, 2006 and has had chronic angina. It was decided to admit him  for elective angiogram to see if his native RCA could be intervened on. Cath  was done September 18, 2006 by Dr. Tresa Endo. The patient had a patent LIMA to  the LAD, patent vein graft to the diagonal, and patent vein graft to the  circ. The RCA was intervened on with three Cypher stents with good final  results. The patient tolerated the procedure well. We feel he can be  discharged September 19, 2006. He will follow up with Dr. Alanda Amass in the  office.   DISCHARGE MEDICATIONS:  1. Coated aspirin once a day.  2. Plavix 75 mg a day.  3. Nexium 40 mg a day.  4. Lescol XL 80 mg a  day.  5. Isosorbide mononitrate 60 mg a day.  6. Cozaar 100 mg a day.  7. HCTZ 25 mg a day.  8. Metoprolol 50 mg a day.  9. Nitroglycerin sublingual p.r.n.   LABORATORIES:  White count 4.9, hemoglobin 11.7, hematocrit 33.1, platelets  113. Sodium 134, potassium 3.3, BUN 12, creatinine 1.1. CK-MB is negative.   DISPOSITION:  The patient discharged in stable condition. Will follow up  with Dr. Alanda Amass in the office.  Dr. Alanda Amass puts in his note to continue on Nexium as an outpatient. I did  order case supplement prior to discharge.      Abelino Derrick, P.A.      Richard A. Alanda Amass, M.D.  Electronically Signed    LKK/MEDQ  D:  09/19/2006  T:  09/21/2006  Job:  [id number]

## 2011-05-10 NOTE — Cardiovascular Report (Signed)
NAMEJAKHI, Mosley NO.:  [id number]   MEDICAL RECORD NO.:  [id number]          PATIENT TYPE:  INP   LOCATION:  2109                         FACILITY:  [facility]   PHYSICIAN:  Nicki Guadalajara, M.D.     DATE OF BIRTH:  Oct 07, 1924   DATE OF PROCEDURE:  06/02/2006  DATE OF DISCHARGE:                              CARDIAC CATHETERIZATION   INDICATIONS:  [name] is a 77 year old white male Jehovah's  Witness patient who has suffered a remote MI in 17 and another one in  1995.  In September 2004, he underwent CABG surgery x4 with a LIMA to his  LAD, saphenous vein to the OM-1, saphenous vein to the diagonal, saphenous  vein to the PDA.  He also underwent aortic valve replacement with a  pericardial  #21 tissue valve.  Patient has a history of hypertension, hiatal hernia,  esophageal stricture, type 2 diabetes mellitus.  He apparently was admitted  in transfer from [facility] as a result of development of chest pressure,  diaphoresis in the setting of rapid atrial fibrillation.  He ruled in for  myocardial infarction with non-ST-segment elevation MI.  He is now referred  for definitive diagnostic cardiac catheterization.   PROCEDURE:  After premedication with Valium 4 mg intravenously, the patient  was prepped and draped in the usual fashion.  His right femoral artery was  punctured anteriorly and a 5-French sheath was inserted.  Diagnostic  catheterization was done with 5-French Judkins #4 left and right catheters.  The right bypass catheter as well as a FL-5 right catheter was used in  attempt to selectively cannulate the vein graft to the right coronary  artery.  A LIMA graft was used for selective angiography into the left  internal mammary artery.  Because of the tissue valve, the valve was not  crossed.  A pigtail catheter was inserted and placed in the central aorta.  Central aortography of the aortic root was performed to help assess  saphenous vein graft  to the RCA to document its occlusion as well as to  assess the competency of the pericardial tissue aortic valve replacement.  Hemostasis was obtained by direct manual pressure.  The patient tolerated  the procedure well.   HEMODYNAMIC DATA:  Central aortic pressure was 175/85.  Mean pressure was  122.   ANGIOGRAPHIC DATA:  Left main coronary artery was a long vessel that had  diffuse 95% distal left main stenosis.   The LAD was totally occluded proximally.   The left circumflex artery had 70% diffuse narrowing just after the left  main stenosis of 95% and then had diffuse 70-80% stenosis.  A flush and fill  phenomenon was seen in the OM-1 vessel due to competitive filling from the  saphenous vein graft.   The native right coronary artery had diffuse disease and was subtotally  occluded with narrowing of diffuse 95% stenosis involving the proximal to  proximal bend of the vessel.  There was some collateralization through the  SA nodal artery.  There was diffuse 30-40% mid RCA stenosis.  There was a  suggestion  of mild competitive filling in the PDA vessel, the right coronary  artery and in a large PLA system.   The saphenous vein graft supplying the diagonal vessel was widely patent but  had smooth narrowing of 20-30% in the proximal third of the vessel.  This  vessel anastomosed into a diagonal vessel and some filling was also seen  antegrade involving a small diagonal branch.  The diagonal vessel beyond the  anastomosis was small caliber and free of significant disease, and there was  suggestion of collateralization to the PDA vessel of the RCA from this SVG  to diagonal system.   The saphenous vein graft supplying the obtuse one marginal vessel was widely  patent without stenoses and anastomosed into a large size OM-1 vessel.   The left internal mammary artery graft was widely patent.  However, it  appeared that this graft anastomosed into a small more distal diagonal  vessel  which is small caliber beyond the graft anastomosis with probable 70%  narrowing in the distal aspect of the small diagonal vessel.  Antegrade to  the graft insertion, the ostium of the diagonal had 95% stenosis and then  the LAD proper itself was seen, visualized further down and extended towards  the apex.  This LAD was small caliber, gave rise to several septal  perforating arteries but the LIMA graft itself does not insert into the LAD  system and consequently the LAD system was somewhat jeopardized by the 95%  ostial diagonal stenosis.   The saphenous vein graft supplying the right coronary artery was totally  occluded at its origin and only a very tiny minimal nubbin was seen in the  most proximal portion of the ring in the aorta.   Central aortography was also performed.  This confirmed total occlusion of  the saphenous vein graft to the RCA.  It also demonstrated competency of the  tissue pericardial valve and there was no evidence for aortic insufficiency.   Distal aortography revealed mild 20% narrowing in the left renal artery.  Otherwise it was free of significant disease.   IMPRESSION:  1.  Severe native coronary obstructive disease with 95% distal left main      stenosis; total occlusion of the proximal left anterior descending      artery; 70% and 80% ostial proximal stenoses of the left circumflex      coronary artery; and diffuse 95% stenosis in the native right coronary      artery after an SA nodal artery with 30-40% narrowing in the mid-right      coronary artery and evidence for mild collateralization to the distal      posterior descending artery from saphenous vein graft to diagonal      filling.  2.  Totally occluded vein graft at its origin which previously had supplied      the posterior descending artery vessel.  3.  Patent saphenous vein graft to the diagonal vessel with smooth 20-30%     narrowing in the proximal third of the graft.  4.  Patent saphenous  vein graft to the obtuse marginal-1 vessel.  5.  Patent left internal mammary artery to the left anterior descending      artery system; however, it appears that the left internal mammary artery      inserts into a smaller more distal diagonal system rather than the left      anterior descending artery proper and the left anterior descending      artery  proper is small caliber and is jeopardized by the 95% ostial      diagonal stenosis which is antegrade to the left internal mammary artery      insertion.  6.  Systemic hypertension.  7.  Well seated #21 pericardial tissue valve without evidence for aortic      insufficiency.   DISCUSSION:  Mr. Jipson presented with non-ST-segment elevation MI  documented in the setting of rapid atrial fibrillation.  He subsequently  converted to sinus rhythm and has remained painfree with anticoagulation and  nitroglycerin.  Current cineangiograms will be reviewed with colleagues  prior to final recommendation concerning increased medical therapy versus  attempt at percutaneous coronary intervention.           ______________________________  Nicki Guadalajara, M.D.     TK/MEDQ  D:  06/02/2006  T:  06/02/2006  Job:  [id number]   cc:   Cristy Hilts. Jacinto Halim, MD  Fax: [phone]   Kerin Perna, M.D.  [address]  [place]  [place] [address]   Madaline Savage, M.D.  Fax: [phone]

## 2011-05-10 NOTE — H&P (Signed)
[name], [name]:  [id number]   MEDICAL RECORD NO.:  [id number]          PATIENT TYPE:  INP   LOCATION:  4703                         FACILITY:  [facility]   PHYSICIAN:  Nanetta Batty, M.D.   DATE OF BIRTH:  1924-06-20   DATE OF ADMISSION:  07/09/2006  DATE OF DISCHARGE:                                HISTORY & PHYSICAL   [name] is a 77 year old married white male with a history of CAD and  valvular heart disease, status post coronary artery bypass graft, October,  2004, by Dr. Kathlee Nations Trigt.  He also had a bovine aortic valve placed as  well.  His other problems include hypertension, dyslipidemia with statin  intolerance, and PAF.  He is a Scientist, product/process development.  He was admitted from June  9 through June 13 with chest pain/subendocardial myocardial infarction.  He  had a cardiac catheterization by Dr. Daphene Jaeger on June 11 revealing occluded  vein to the RCA with a recannulized proximal dominant right.  His LIMA went  to a small diagonal branch which had a 95% ostial stenosis, and the LAD was  on bypass.  He had a vein graft to a diagonal and OM.  His anatomy was  reviewed by myself and Dr. Elsie Lincoln, who both agreed that medical therapy was  appropriate at this time, and he was discharged home.  Plans were to  initiate EECP, which he had two weeks of, and this was discontinued because  of trauma to his lower extremities.  He was admitted to [facility] last night with chest pain.  He has a known chronic right bundle  branch block.  His enzymes were negative, though they did trend up within  normal the range.  Patient was transferred here for further evaluation.   MEDICATIONS:  1.  Lopressor 25 mg p.o. b.i.d.  2.  Aspirin 81.  3.  Cozaar 100.  4.  Isosorbide 60.  5.  Lasix 40.  6.  Potassium 10 mEq.   ALLERGIES:  AMIODARONE, KEFLEX, AND PENICILLIN.   REVIEW OF SYSTEMS:  See above.   PHYSICAL EXAMINATION:  VITAL SIGNS:  Blood pressure  158/86 with a pulse of  64 and a sat of 95 on 2 liters.  LUNGS:  Clear.  NECK:  His carotids are 2+.  HEART:  Regular rate and rhythm with a soft outflow tract murmur.  ABDOMEN:  Soft.  He has trace peripheral edema and 1+ pedal pulses.   A 12-lead EKG performed at [facility] revealed a sinus rhythm of 65  with right bundle branch block.   His laboratory exam, which was done at [facility], revealed a sodium of 139,  potassium 3.9, chloride 105, CO2 28, glucose 110, creatinine 0.9, BUN 16.  CPK was 41 with an MB of 3.  Troponin was 0.16, upper normal of 0.4.  The  CPK peaked at 0.4.  Hemoglobin was 12.3, hematocrit 34.6, white blood cell  count 3.9, platelet count 122.   IMPRESSION:  Mr. Sweetman had recurrent angina with recent catheterization  approximately one month ago.  I  do not think he has any viable percutaneous  options.  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  He could have a redo bypass operation with  reimplantation of his left internal mammary artery to his left anterior  descending artery, vein graft to the diagonal and a vein graft to the right.  I will ask Dr. Donata Clay to re-evaluate Mr. Rosas for this.  I will also  put him on IV heparin.  His wife and family friend were present during the  interview.      Nanetta Batty, M.D.  Electronically Signed     JB/MEDQ  D:  07/09/2006  T:  07/09/2006  Job:  [id number]

## 2011-05-10 NOTE — Discharge Summary (Signed)
NAME:  [name], [name]                         ACCOUNT NO.:  [id number]   MEDICAL RECORD NO.:  [id number]                   PATIENT TYPE:  INP   LOCATION:  2028                                 FACILITY:  [facility]   PHYSICIAN:  Kerin Perna, M.D.               DATE OF BIRTH:  June 02, 1924   DATE OF ADMISSION:  09/26/2003  DATE OF DISCHARGE:  10/05/2003                                 DISCHARGE SUMMARY   HISTORY OF PRESENT ILLNESS:  This patient is a 77 year old white male  Jehovah's Witness who was recently hospitalized at [facility] for angina  and underwent cardiac catheterization by Richard A. Alanda Amass, M.D.  He was  found to have an 80% stenosis of the LAD, a 90% stenosis of the diagonal,  and a chronically occluded right coronary artery with no significant disease  of the circumflex.  His ejection fraction was calculated at 55%.  A 2-D  echocardiogram was also performed which showed a 30 mm gradient across the  aortic valve.  He was found to be a candidate for AVR CABG and was referred  to Kerin Perna, M.D. for surgical opinion.  His hematocrit was noted to  be 33% and as he was a known Jehovah's Witness and refused transfusion, he  was discharged on oral iron and was scheduled to have the surgery at a later  date once his blood count was improved.  The patient was admitted this  hospitalization for the procedure and preoperative hemoglobin and hematocrit  were now 38% and 13.2.   MEDICATIONS ON ADMISSION:  1. Norvasc 5 mg daily.  2. Atenolol 100 mg daily.  3. Hydrochlorothiazide 25 mg daily.  4. Avapro 300 mg daily.  5. Iron 150 mg b.i.d.  6. Imdur 30 mg daily.  7. Prilosec 20 mg daily.  8. Potassium 20 mEq daily.  9. Zocor 20 mg q.h.s.  10.      Nitroglycerin 0.4 mg p.r.n.  11.      Aspirin 81 mg daily.  12.      Colace 200 mg daily.  13.      Vitamin E and vitamin C.   ALLERGIES:  PENICILLIN.   PAST MEDICAL HISTORY:  1. Severe three vessel coronary artery  disease.  2. Significant aortic stenosis.  3. Status post myocardial infarction 1995.  4. Diet controlled diabetes.  5. Hypertension.  6. History of pan cytopenia.  7. History of Jehovah's Witness with refusal for any blood products.   HOSPITAL COURSE:  The patient was admitted electively and on September 26, 2003  underwent the following procedure:  Coronary artery bypass grafting x4 with  a left internal mammary artery to the LAD, a saphenous vein graft to the  diagonal, a saphenous vein graft to the obtuse marginal, a saphenous vein  graft to the posterior descending.  Other procedure performed was an aortic  valve replacement with a 21  mm pericardial tissue valve.  The patient  tolerated the procedure well and was transferred to the surgical intensive  care unit in stable condition.  Postoperative hospital course patient has  done well.  He has maintained stable hemodynamics.  All routine lines,  monitors, and drainage devices discontinued in a standard fashion in the  intensive care unit.  His hematocrit and hemoglobin have remained stable.  The most recent values dated on September 29, 2003 were 12.1 and 34.9,  respectively.  The patient did have episode of postoperative atrial  fibrillation, but has been chemically cardioverted back to a normal sinus  rhythm.  Additionally, the patient is not felt to be a candidate for  Coumadin and he is placed on aspirin.  The patient also had a left arm  superficial phlebitis/cellulitis related to an IV site that has been treated  with antibiotics and will continue as an outpatient.  This has, however,  shown significant clinical improvement with less erythema and induration as  well as less tenderness.  The patient's other incisions are healing quite  well.  He is tolerating diet and activities commensurate for level of  postoperative convalescence.  He is afebrile.  Oxygen has been weaned and he  maintains adequate saturations on room air.  He has  undergone a gentle  diuresis responding well.  However, he does have some peripheral edema and  will require further diuresis as an outpatient.  Overall, the patient is,  however, felt to be quite stable for discharge on today's date, October 05, 2003.   CONDITION ON DISCHARGE:  Stable and improved.   DISCHARGE MEDICATIONS:  1. Niferex 150 mg daily.  2. Aspirin 81 mg daily.  3. Lopressor 25 mg q.12h.  4. Lasix 40 mg daily for seven days.  5. K-Dur 20 mEq daily for seven days.  6. Prilosec 20 mg daily.  7. Amiodarone 400 mg b.i.d. for five days, then 200 mg b.i.d.  8. Norvasc 5 mg daily.  9. Avapro 300 mg daily.  10.      Keflex 500 mg q.6h. for an additional seven days.  11.      For pain Ultram 50-100 mg q.4-6h. p.r.n.   FINAL DIAGNOSES:  1. Severe coronary artery disease.  2. Aortic stenosis.  3. Previous myocardial infarction.  4. Diet controlled diabetes.  5. Hypertension.    DISCHARGE INSTRUCTIONS:  The patient will receive written instructions in  regard to medications, activity, diet, wound care, and follow-up.  Follow-up  will include an appointment to see Kerin Perna, M.D. in three weeks.      Rowe Clack, P.A.-C.                    Kerin Perna, M.D.    Sherryll Burger  D:  10/05/2003  T:  10/05/2003  Job:  [id number]   cc:   Gerlene Burdock A. Alanda Amass, M.D.  [phone] [address], [address]  [place]  [place] [address]  Fax: [phone]   Romeo Rabon, M.D.

## 2011-05-10 NOTE — Consult Note (Signed)
NAME:  [name], [name]                         ACCOUNT NO.:  [id number]   MEDICAL RECORD NO.:  [id number]                   PATIENT TYPE:  INP   LOCATION:  4707                                 FACILITY:  [facility]   PHYSICIAN:  Evelene Croon, M.D.                  DATE OF BIRTH:  1924-08-09   DATE OF CONSULTATION:  08/30/2003  DATE OF DISCHARGE:                                   CONSULTATION   CARDIOVASCULAR SURGICAL CONSULTATION:   REFERRING PHYSICIAN:  Thereasa Solo. Little, M.D.   REASON FOR CONSULTATION:  Severe three-vessel coronary disease.   HISTORY OF PRESENT ILLNESS:  This patient is a 77 year old Jehovah's  Witness, with a history of coronary disease, who was admitted to [facility] on August 24, 2003 with an acute inferior wall MI  with ST elevation inferiorly.  He was also noted to be in atrial  fibrillation with a rapid ventricular response.  He was transferred to [facility] on September 03. 2004 for further treatment.  His ST changes apparently  resolved with nitroglycerin.  At the time of his initial presentation, he  developed chest pain about 9:00 at night.  It was described as midsternal,  heaviness and radiating up into his neck.  In the emergency room, he was  noted to have acute ST elevations inferiorly.  He did have a history of  previous inferior wall MI in 1995.   He underwent cardiac catheterization, yesterday, which showed 30-40% distal  left main stenosis.  The LAD had 40% proximal stenosis extending into the  septal perforator, which had about 80% stenosis.  The LAD gave off a small  first diagonal and had 95% stenosis and a larger second diagonal that had  90% proximal and mid-vessel stenosis.  The left circumflex gave off a single  branch and had no significant disease.  The right coronary artery appeared  chronically occluded with bridging collaterals filling the distal vessel.  There were also left-to-right collaterals filling the  distal vessel.  There  was a 13-mm gradient across the aortic valve.  Ejection fraction was about  55%.   PAST MEDICAL HISTORY:  1. Significant for coronary disease, as mentioned above, status post     myocardial infarction in 1978 and 1995.  2. He has a history of hypertension.  3. He has a history of diet-controlled diabetes.  4. He has hypercholesterolemia.  5. He did have paroxysmal atrial fibrillation on this admission.  6. He is status post a cholecystectomy.  7. He is status post surgery for kidney stone with a stent placed.  8. He has a history of pancytopenia and has been evaluated by hematologists     in [place].  He has been seen by hematology here at [facility], and his     workup is ongoing by Dr. Arline Asp.   MEDICATIONS PRIOR TO ADMISSION:  1. HCTZ  25 mg q.d.  2. K-Dur 20 mEq q.d.  3. Colace 200 mg q.d.  4. Prilosec 20 mg q.d.  5. Avapro 300 mg q.d.  6. Atenolol 50 mg q.d.  7. He was also taking aspirin 325 mg q.d.   ALLERGIES:  1. LIPITOR.  2. LOPID.  3. PENICILLIN/  4. PROCARDIA.  5. VASOTEC.  6. MAXZIDE.  7. AVAPRO.  8. NIASPAN.  9. CELEBREX.  10.      PRAVACHOL.   REVIEW OF SYSTEMS:  As noted in his admission history and physical.  GENERAL:  He denies any fever or chills.  Has had no recent weight changes.  EYES:  Negative.  ENT:  Negative.  ENDOCRINE:  He does have type 2 diabetes.  He denies hypothyroidism.  CARDIOVASCULAR:  As above.  He has had history of  coronary disease.  He was admitted with unstable angina.  He denies PND and  orthopnea.  He does have fatigue.  RESPIRATORY:  He denies cough and sputum  production.  GASTROINTESTINAL:  He has had no nausea or vomiting.  He denies  melena and bright red blood per rectum.  GENITOURINARY:  He denies dysuria  and hematuria.  NEUROLOGICAL:  He has had no focal weakness or numbness.  Denies dizziness and syncope.  He has never had a TIA or a stroke.   SOCIAL HISTORY:  He is a nonsmoker and denies  alcohol abuse.  He lives with  his wife.   FAMILY HISTORY:  Significant for coronary disease.  His mother died of MI at  61, and his father died of an MI in his 54s.   PHYSICAL EXAMINATION:  VITAL SIGNS:  His blood pressure is 150/60 with a  pulse of 55 and regular; respiratory rate is 16 unlabored.  GENERAL:  He is a well-developed elderly white male in no distress.  HEENT:  Exam shows him to be normocephalic and atraumatic.  Pupils are equal  and reactive to light and accommodation.  Extraocular muscles are intact.  His throat is clear.  NECK:  Exam shows normal carotid pulses bilaterally.  There is a transmitted  murmur to both sides of his neck.  There is no adenopathy or thyromegaly.  CARDIAC:  He has a regular rate and rhythm with a grade 2/6 systolic murmur  over the aorta.  RESPIRATORY:  Shows clear lungs.  ABDOMEN:  Exam shows active bowel sounds.  Abdomen is soft, mildly obese and  nontender.  There are no palpable masses or organomegaly.  EXTREMITIES:  Exam shows no peripheral edema.  His pedal pulses are  diminished but palpable bilaterally.  SKIN:  Warm and dry.  NEUROLOGIC:  Exam shows him to be alert, oriented x 3.  Motor and sensory  exams grossly normal.   LABORATORY DATA:  On August 30, 2003 show normal electrolytes with a BUN  of 19 and creatinine of 1.0.  his white blood cell count is 4.6, hemoglobin  11.5, hematocrit 33.1, platelet count 132,000.  His hemoglobin on admission  to [facility] was apparently 13.4.  His hemoglobin here, on August 28, 2003, was 9.9.  He has an elevated PT of 15.8 with an INR of 1.4, which  corrects with mixing, suggesting factor deficiency.  His PTT was 47,  correcting to 36, suggesting factor deficiency.  Bleeding time was 7 minutes  on aspirin.  His factor VIII activity was 116%, which is within normal  limits.  His prothrombin or factor II level was 70%, which  is low.  The remainder of his crenulation workup is pending,  including von Willebrand  antigen and factor V assay.  His ristocetin cofactor is pending.  His factor  X level was 63%, which is within normal limits.  His CPK, on August 26, 2003, was 233 with an MB of 15, troponin level of 3.81.  Liver function  profile was essentially normal.  His iron level was 50 with a percent  saturation of 22%, ferritin level of 82.  Chest x-ray showed mild congestive  heart failure.  Electrocardiogram showed sinus bradycardia with an old  inferior MI.   IMPRESSION AND RECOMMENDATIONS:  This patient has severe three-vessel  coronary disease, presenting with acute inferior myocardial infarction.   I agree that coronary artery bypass graft surgery is the best treatment for  his coronary disease.  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  His hemoglobin of 11.5 would be low to undergo  coronary bypass surgery.  Ideally, we would like to have a hemoglobin of  greater-than-or-equal to 13.  We would also like to have him off of aspirin  and any other anticoagulant agents for at least 10 days preoperatively.  I  would recommend discontinuing his aspirin now and not starting Plavix.  I  would recommend treating him with epoetin and iron, and continuing his  hematologic workup under the direction of hematology to decide if any other  therapies are warranted to minimize his risk of bleeding with surgery.  I  discussed the operative procedure with he and his wife, including  alternatives, benefits and risks.  I discussed the requirements for  undergoing coronary bypass surgery from a hematologic standpoint.  I told  Rosanne Ashing that I would be working at [employer] after this Friday, and I did not  think he would be ready for surgery for at least a week and that another  surgeon would need to perform his surgery.  He would like to have Dr. Donata Clay do his surgery.  I will arrange this.                                               Evelene Croon, M.D.    BB/MEDQ  D:  08/31/2003  T:  09/01/2003  Job:  [id number]   cc:   Medical record

## 2011-05-10 NOTE — Op Note (Signed)
NAME:  [name], [name]                         ACCOUNT NO.:  [id number]   MEDICAL RECORD NO.:  [id number]                   PATIENT TYPE:  INP   LOCATION:  2304                                 FACILITY:  [facility]   PHYSICIAN:  Kerin Perna III, M.D.           DATE OF BIRTH:  06-16-1924   DATE OF PROCEDURE:  09/26/2003  DATE OF DISCHARGE:                                 OPERATIVE REPORT   PREOPERATIVE DIAGNOSES:  1. Left main and three-vessel coronary artery disease with class IV     progressive angina.  2. Moderate aortic stenosis.   POSTOPERATIVE DIAGNOSES:  1. Left main and three-vessel coronary artery disease with class IV     progressive angina.  2. Moderate aortic stenosis.   OPERATION:  1. Coronary artery bypass grafting x4 (left internal mammary artery to left     anterior descending coronary artery, saphenous vein graft to diagonal,     saphenous vein graft to obtuse marginal 1, saphenous vein graft to     posterior descending).  2. Aortic valve replacement with a 21 mm pericardial tissue valve.   SURGEON:  Kerin Perna, M.D.   ASSISTANT:  Coral Ceo, P.A.   ANESTHESIA:  General by Sheldon Silvan, M.D.   INDICATIONS:  The patient is a 77 year old white male with a recent  hospitalization for class IV angina.  Cardiac catheterization and 2 D echo  demonstrated severe coronary disease with left main stenosis, ejection  fraction of 50%, and moderate aortic stenosis with a transaortic gradient  measured of 25-30 mmHg.  He is felt to be a candidate for coronary bypass  grafting and aortic valve replacement.   Prior to surgery I examined the patient in the office and reviewed the  results of the cardiac catheterization and 2 D echo with the patient and his  wife.  We had delayed his surgery in order to augment his hemoglobin and  hematocrit due to his wishes as a Jehovah's Witness not to have any blood  products administered during this cardiac operation.  His  hematocrit reached  40% and was felt to be safe to schedule him for coronary bypass grafting and  aortic valve replacement.  At that time in the office I again reviewed the  procedure with the patient and his wife, including the choice of conduit for  grafts, the choice of a bioprosthetic valve if possible, the location of the  surgical incisions, use of general anesthesia and cardiopulmonary bypass,  and the plan not to use any blood products during surgery.  We discussed the  postoperative recovery and that included both the short-term recovery in the  hospital and longer-term recovery at home.  He understood that we would not  use blood transfusion, FFP, platelets, but we would use Cell Saver, albumin,  and hetastarch, and strategies to augment his own coagulation system to  include aprotinin and DDAVP.  These measures were  acceptable to the patient  and family.  We discussed the alternatives to surgical therapy for treatment  of his heart disease and the expected outcome of those alternative  therapies.  After our discussion, he agreed to proceed with the operation as  scheduled under what I felt was an informed consent.   OPERATIVE FINDINGS:  The preoperative TEE documented a moderate aortic  stenosis and 1-2+ aortic insufficiency.  The coronaries were with  significant calcified disease.  The saphenous vein was small but adequate  and was harvested endoscopically from the right upper and right mid-leg.  The mammary artery was a good conduit with excellent flow.  The LAD distally  was very small and was also intramyocardial, and the graft was placed in the  mid-LAD where the vessel was graftable.   DESCRIPTION OF PROCEDURE:  The patient was brought to the operating room and  placed supine on the operating table, where general anesthesia was induced  under invasive hemodynamic monitoring.  The chest, abdomen, and legs were  prepped with Betadine and draped as a sterile field.  A  sternal incision was  made as the saphenous vein was harvested from the leg.  It should be noted  that the aprotinin protocol was used for this operation.  The left internal  mammary artery was harvested as a pedicle graft from its origin at the  subclavian vessel and was a good vessel with excellent flow.  Heparin was  administered and the ACT was documented as being therapeutic.  The sternal  retractor was placed.  The pericardial cradle was created.  Through  pursestrings placed in the ascending aorta and right atrium, the patient was  cannulated, placed on bypass, and cooled to 32 degrees.  The coronaries were  identified for grafting.  A left ventricular vent was placed via the right  superior pulmonary vein.  Cardioplegia cannulas were placed for both  antegrade aortic and retrograde coronary sinus cardioplegia administration.  The saphenous vein was prepared for the distal anastomoses and the patient  was cooled to 30 degrees.  As the aortic crossclamp was applied, a total of  800 mL of cold blood cardioplegia was delivered in split doses between the  antegrade aortic and retrograde coronary sinus catheters.  There was good  cardioplegic arrest and septal temperature dropped to less than 14 degrees.  Topical ice saline was used to augment myocardial preservation and a  pericardial insulator pad was used to protect the left phrenic nerve.   The distal coronary anastomoses were then performed.  The first distal  anastomosis was to the posterior descending.  This had a proximal 95-99%  stenosis.  A reversed saphenous vein was sewn end-to-side with a running 7-0  Prolene with good flow through the graft.  It should be noted that the  proximal portion of the posterior descending vein was quite small, measuring  less than 1.5 mm.  The second distal anastomosis was to the second diagonal. This was a 1.5 mm vessel with proximal 90% stenosis.  A reverse saphenous  vein was sewn end-to-side  with running 7-0 Prolene with good flow through  the graft.  The third distal anastomosis was to the OM-1, which was  intramyocardial.  A reversed saphenous vein was sewn end-to-side to this 1.5  mm vessel with running 7-0 Prolene, and there was good flow through the  graft.  Cardioplegia was redosed.  Again it should be noted that the vein  grafts were small, 1-2 mm in  diameter.  Next the left mammary graft was  placed to the mid-LAD.  It was placed before the LAD became intramyocardial  and then very small close to the apex.  At the point of the anastomosis, the  LAD measured 1.5 mm and had a proximal tight 90% stenosis.  The left IMA  graft was brought through an opening created in the left lateral pericardium  and was brought down onto the LAD and sewn end-to-side with running 8-0  Prolene.  There was excellent flow through the anastomosis with immediate  rise in septal temperature after briefly releasing the atraumatic vascular  pedicle bulldog clamp on the mammary pedicle.  The bulldog was reapplied and  the pedicle was secured to the epicardium.  Cardioplegia was re-  administered.   Attention was then directed to the aortic valve.  A transverse aortotomy was  made.  The aortic valve was inspected and found to be tricuspid, calcified,  thickened, and stenotic.  The leaflets were excised.  The annulus was  irrigated with cold irrigation.  The annulus was sized to a 21 mm  pericardial valve.  Fourteen subannular 2-0 horizontal pledgeted sutures  were placed around the annulus and then through the sewing ring of the  valve, which had been washed and prepared per protocol.  The valve was  seated and the sutures were tied.  The valve was seated well without  obstruction of the coronaries, and there was no evidence of spaces that  would result in a paravalvular leak.  The aortotomy was then closed in two  layers using running 4-0 Prolene.  Cardioplegia was redosed.  Next the three   proximal vein anastomoses were placed on the ascending aorta while the clamp  was still in place.  Prior to removing the clamp, air was vented from the  left side of the heart using retrograde warm blood cardioplegia as well as  the usual de-airing maneuvers.  The crossclamp was removed and the heart was  reperfused.  It was cardioverted back to a regular rhythm.  The grafts were  opened and found to be with good flow and hemostasis, which was documented  at the proximal and distal sites.  The patient was rewarmed to 37 degrees.  Temporary pacing wires were applied.  The cardioplegia cannulas had been  removed.  The LV vent had been removed.  The lungs were re-expanded and the  ventilator was resumed.  The patient was then weaned from bypass without  difficulty.  Low-dose dopamine and milrinone were used.  The cardiac output  and blood pressure were stable off pump.  Protamine was administered without adverse reaction.  The cannulas were removed and the mediastinum was  irrigated with warm antibiotic irrigation.  A dose of DDAVP was given  following the protamine.  The superior pericardial fat was closed over the  aorta.  Two mediastinal and a left pleural chest tube were placed and  brought out through separate incisions.  The sternum was closed with  interrupted steel wire.  The pectoralis fascia was closed with a running #1  Vicryl.  The subcutaneous and skin were closed with a running Vicryl and  sterile dressings were applied.  Bypass time was 200 minutes with crossclamp  time of 80 minutes.  No blood products were administered to the patient  during the operation.  Mikey Bussing, M.D.    PV/MEDQ  D:  09/26/2003  T:  09/27/2003  Job:  [id number]   cc:   Gerlene Burdock A. Alanda Amass, M.D.  [phone] [address]  [place]  [place] [address]  Fax: [phone]   Chrisandra Carota, M.D., [facility]

## 2011-05-10 NOTE — Discharge Summary (Signed)
NAME:  [name], [name]                         ACCOUNT NO.:  [id number]   MEDICAL RECORD NO.:  [id number]                   PATIENT TYPE:  INP   LOCATION:  4707                                 FACILITY:  [facility]   PHYSICIAN:  Madaline Savage, M.D.             DATE OF BIRTH:  [birth year]   DATE OF ADMISSION:  08/26/2003  DATE OF DISCHARGE:  09/02/2003                                 DISCHARGE SUMMARY   ADMISSION DIAGNOSES:  1. Acute inferior myocardial infarction August 24, 2003 with proximal     atrial fibrillation, status post myocardial infarction 1995 and 1978.  2. Anemia, thrombocytopenia, elevated protime and PTT in a Jehovah Witness.  3. Hypertension.  4. Adult onset diabetes mellitus, diet controlled type 2.  5. Hypercholesterolemia.  6. Hiatal hernia with stricture/esophageal dysmotility.   DISCHARGE DIAGNOSES:  1. Acute inferior myocardial infarction with atrial fibrillation status post     myocardial infarction 1995 and 1978.  2. Three vessel coronary artery disease with 100% right coronary artery     stenosis, 30% to 40% left main stenosis, 40% left anterior descending     stenosis, 80% ramus, 95% D-1, and 90% proximal distal D-2 stenosis.     Ejection fraction 35%.  3. Aortic stenosis with gradient of 17.1 mmHg and an estimated aortic valve     area of 1.12 cm.  4. Anemia with hematology workup to rule out factor deficiencies.  5. Hypertension.  6. Adult onset diabetes mellitus, non-insulin dependent, diet controlled.  7. Hypercholesterolemia.  8. Hiatal hernia with esophageal stricture and history of esophageal     dysmotility.   PROCEDURE:  1. Cardiac catheterization August 30, 2003.  2. 2-D echocardiogram September 8. 2004.  3. CT of the abdomen and pelvis August 31, 2003.   HISTORY OF PRESENT ILLNESS:  The patient is a 77 year old white male medical  patient of Dr. Lavell Luster and Dr. Laural Golden in [place], [place].  The patient presented to  the hospital in [place] on August 24, 2003 with  chest pain and was found to have an inferior myocardial infarction. He also  had atrial fibrillation with a rapid ventricular response, which was  converted to a sinus rhythm. Complicating issues was a bleeding diathesis  and a history of pancytopenia in a patient who is a Jehovah Witness and  would not accept any blood products. He was stabilized in [place] and  subsequently transferred to [facility]. He was received by Dr.  Domingo Sep and eventually underwent cardiac catheterization by Dr. Chanda Busing. On presentation, he had a PTT of 47, a ProTime of 15.3 with INR of  1.3. He mentioned that in 1995, after sedation, he was given an anti-  thrombolytic treatment and underwent catheterization and experienced a large  bleed and almost died. Per patient report, he was evaluated by Dr. Cedric Fishman,  Hematology Service in [place] 2 years ago, to followup on  his low platelet  count and there is also a history of needing a bone marrow, which is still  pending. He was seen again at the emergency department by Dr. Cedric Fishman, who  raised the question of MDS versus other blood dyscrasias and subsequently  transferred here for further workup, cardiac catheterization and any  treatment as indicated.    PAST MEDICAL HISTORY:  1. Includes prior myocardial infarction in 1978 and 1995.  2. Hypertension.  3. History of positional vertigo.  4. History of hiatal hernia with a stricture and esophageal dysmotility.  5. History of diabetes, diet controlled.  6. Hypertension.   ADMISSION MEDICATIONS:  Include Hydrochlorothiazide 25 mg q.d., K-Dur 20 mEq  q.d., Colace 200 mg q.d., Prilosec 20 mg q.d., Avapro 300 mg q. a.m.,  Atenolol 50 mg q. a.m. and coated aspirin.   ALLERGIES:  LIPITOR, LOPID, BETA BLOCKERS, PROCARDIA, PENICILLIN, VASOTEC,  MAXZIDE, AVAPRO, NIASPAN, CELEBREX, AND PRAVACHOL.   HISTORY AND PHYSICAL:  For further history and  physical, please see the  dictated note.   HOSPITAL COURSE:  The patient was admitted here and prior to cardiac  catheterization with his history, a hematology consult was obtained from Dr.  Arline Asp. He saw the patient and initiated a coagulation workup. This was  fairly detailed. There was no evidence for a hemolytic process with normal  LDH, retic, and heptoglobin. There was also a negative Combs test. PT was  15.3 and PTT was 47. Studies included the following. Antiphospholipid and a  D2 GLY, IGG/IGM are still pending. Ristocetin co-factor was 144. Von  Willebrand's was 172. Coag factor 8 was 116. Coag factor 5 was 64. Coag  factor 10 was 63. Coag factor 2 was 70. He ultimately underwent cardiac  catheterization on August 30, 2003, at which time he was found to have a  100% RCA, a 30% to 40% left main, a 40% LAD, an 80% ramus, 95% D1, 90%  proximal and distal D2 stenosis. The circumflex was normal. Ejection  fraction was 55%. A left renal artery showed a 30% stenosis and there was  thought to be mild aortic stenosis. A 2-D echocardiogram was performed the  following day and showed overall LV function in the lower limits of normal.  Ejection fraction was estimated at 55% with mild hypokinesis of the basal  inferior wall. Left ventricular thickness was mild to moderately increased.  Aortic valve thickness was mildly increased. The aortic valve was calcified.  Findings were consistent with mild aortic valve stenosis. There was mild  aortic valvular regurgitation. The mean transaortic valve gradient was 17.1  mmHg. Estimated aortic valve area by V-max was 1.12 centimeter squared.  There was minimal calcification of the mitral valve. There was mild mitral  valve regurgitation. The left atrium was mildly dilated. CT scan was also  obtained and his hematology workup, which showed a bilateral pleural effusion with basilar atelectasis. Coronary calcifications were noted. There  were numerous  lesions in the liver. The one that was measurable and was  thought to be a cyst. It is also noted that he is status post  cholecystectomy, probable small parapelvic cyst of the left kidney with no  evidence of retroperitoneal hematoma. CT scan of the pelvis was also  unremarkable.   With the above information in mind, he was seen in consultation by Dr. Rexanne Mano of [facility]. Dr. Laneta Simmers agreed that he was a candidate for coronary  artery bypass grafting and he was ultimately seen by Dr. Zenaida Niece  Trigt because  of scheduling. It was ultimately determined that he was a candidate for  coronary artery bypass grafting and that he also might require aortic valve  replacement. Prior to this, it was necessary to increase his hemoglobin,  since he did not want blood products, based on his faith.   LABORATORY DATA:  As of August 30, 2003, his hemoglobin is 11.5 with a  hematocrit of 33.1, white count of 4.6, platelet count is now 132,000. It  was 113,000 on admission. As noted, his retic count is 1.2. RBC is 3.82.  Retic was 45.8. ProTime on August 27, 2003 was 47. Activated PTT was 36  with a 1 to 1 mixture and 43 with a 4 to 1 mix. Factor 8 study was 125.  Ristocetin co-factor was 144. Von Willebrand's antigen was 172. Coag factor  8 activity was 116%. Factor 5 assay was 64. Factor 10 was 63. Factor 2  prothrombin was 70. As noted above, the anti-phospholipids are still  pending. Electrolytes are normal with a sodium of 137, potassium of 3.9,  chloride of 102, CO2 of 29. A glucose of 105, BUN of 19, creatinine of 1.  LFT's on August 27, 2003 showed an AST of 38, a total bilirubin of 1.3, a  magnesium of 1.4, with his LFT's being normal. CK MB on transfer showed a CK  of 233, CK MB of 15 and an index of 6.4. Troponin 1 was 3.81. Second set of  studies showed his CK down to 140 with a CK MB of 4.1 and a relative index  of 2.9. Troponin at that time was 3.83. Lipid profile showed a cholesterol  of  128, a triglyceride of 71, HDL of 32, TSH of 1.98. Iron was 50. Total  iron binding capacity was 225. Saturation was 22%. B12 was 4.61. RBC folate  was 7.30, Ferritin was 82. Heptoglobin was 110.   DISPOSITION:  By September 02, 2003, the patient was quite stable. He had  been also placed on Aranesp to help build up his hemoglobin and hematocrit.  At this point, it was decided that the patient was stable and could go home  in order to build his blood count. He was scheduled to return to see Dr. Donata Clay on Friday, September 16, 2003. He will go home on the following  medications.   DISCHARGE MEDICATIONS:  1. Norvasc 5 mg q.d.  2. Atenolol, which is increased to 100 mg q.d.  3. Hydrochlorothiazide 25 mg q.d.  4. Avapro 300 mg q.d.  5. Niferex 150 mg b.i.d. with lunch and supper.  6. Imdur 30 mg q.d.  7. Prilosec 20 mg q.d.  8. Vitamin K 10 mg q.d.  9. Potassium 20 mEq p.o. q.d. 10.      Zocor 20 mg q.d. at bedtime.  11.      Nitroglycerin 0.4 mg p.r.n. q. 5 x3. The patient is to call if it     takes more than 2 and he is going to a third nitroglycerin for chest     pain.  12.      Aspirin 81 mg q.d.  13.      Colace 200 mg q.d.   ACTIVITY:  He is to walk daily.   DIET:  Maintain a low-salt, diabetic diet.   SPECIAL INSTRUCTIONS:  He is to call our office for any concerns not  addressed. The case manager is attempting to set up followup for his Aranesp  injections weekly.  The patient instructed that if that fails, to call Dr.  Arlyce Dice on Monday, at which time Dr. Arlyce Dice will coordinate obtaining his  Aranesp. He can also obtain a CBC, which Dr. Donata Clay would like him to  have done 2 days prior to his visit on September 16, 2003.   CONDITION ON DISCHARGE:  Stable.      Eber Hong, P.A.                 Madaline Savage, M.D.    WDJ/MEDQ  D:  09/02/2003  T:  09/02/2003  Job:  [id number]   cc:   Teena Irani. Arlyce Dice, M.D.  [address]  [place]  [place] [address]  Fax:  [phone]   Laural Golden, M.D.  [place], [place]   Evelene Croon, M.D.  [address]  [place]  [place] [address]   Kathlee Nations Suann Larry, M.D.  [address]  [place]  [place] [address]   Samul Dada, M.D.  [address] [facility]  [place]  [place] [address]  Fax: [phone]   Dani Gobble, MD  [phone] [address]  [place]  [place] [address]  Fax: [phone]   Dr. Cedric Fishman  Hematology/Oncology Service  [place], [place]

## 2011-05-10 NOTE — Discharge Summary (Signed)
NAMECALIB, WADHWA NO.:  [id number]   MEDICAL RECORD NO.:  [id number]          PATIENT TYPE:  INP   LOCATION:  2031                         FACILITY:  [facility]   PHYSICIAN:  Richard A. Alanda Amass, M.D.DATE OF BIRTH:  [birth year]   DATE OF ADMISSION:  05/31/2006  DATE OF DISCHARGE:  06/04/2006                                 DISCHARGE SUMMARY   DISCHARGE DIAGNOSES:  1.  Silent anterior myocardial infarction this admission.  2.  Paroxysmal atrial fibrillation this admission, discharged in sinus      rhythm.  3.  Known coronary disease with coronary artery bypass grafting October 2004      with a tissue aortic valve replacement at that time.  4.  Jehovah's Witness.  5.  Hypertension.  6.  History of dyslipidemia with Statin intolerance in the past.  7.  Noninsulin-dependent diabetes, diet controlled.   HOSPITAL COURSE:  [name] is a 77 year old Jehovah's Witness, who had a  remote MI in 59. He had another MI in 1995 and ultimately had bypass  surgery x4 in September of 2004 with a tissue aortic valve replacement. He  did apparently have some PAF after his bypass surgery and was put on  amiodarone but had some reaction to this and says that he was unable to  tolerate it and it was stopped as an outpatient. We saw him back once after  his bypass in 2004 but we had not seen him since. He has been followed by  his primary care doctor and has done well. He was admitted May 31, 2006  after he presented to the emergency room in [place] the previous evening  with rapid AF and chest pain. His enzymes were positive with a peak CK of  495 and 36 MB's and a troponin of 10. He converted spontaneously with  Cardizem and was transferred to [facility]. He was put on IV  heparin. He was seen by Dr. Tresa Endo on the morning of May 31, 2006. He was  relatively pain free at that point. He was set up for diagnostic  catheterization, which was done June 02, 2006. Please see  Dr. Landry Dyke  dictation for complete details. The SVG to the RCA was occluded. There were  some faint collaterals to the distal PDA. There was a patent LIMA to the  LAD, however, it appeared that the LIMA inserted to a small distal diagonal  system, rather than to the LAD itself. There was a patent vein graft to a  diagonal and patent vein graft to the circumflex. EF was 50%. Renal arteries  were normal except for a 20% left renal artery stenosis. Iliac's were  normal. The patient tolerated the procedure well. His films were reviewed  with Dr. Allyson Sabal and Dr. Elsie Lincoln and ultimately it was decided, after  discussing this with the patient, that medical therapy would be tried. The  patient has had some anemia problems in the past and does take  erythropoietin but will not take blood products. Plan is for outpatient EECP  in a couple of weeks. He has an appointment to  see Dr. Alanda Amass in the  office on June 26, 2006 at 2:00 p.m. He should not have recurrent atrial  fibrillation during this hospitalization.   DISCHARGE MEDICATIONS:  1.  Aspirin has been increased to 325 mg once daily.  2.  Diltiazem is new, 120 mg once daily.  3.  Cozaar, as taken prior to admission, 100 mg daily.  4.  Metoprolol has been increased to 50 mg twice a day.  5.  Isosorbide mononitrate is new at 60 mg daily.  6.  Prilosec is as prior to admission, once daily.  7.  Nitroglycerin p.r.n.  8.  He had been taking Lasix at home and we told him that he could take 1/2      of a 40 mg 2 or 3 times a week if needed for swelling only.   LABORATORY DATA:  His INR on admission was 1.1. At discharge, hemoglobin is  12.1, hematocrit 35. His platelets did drop to 106 while on heparin but on  discharge, have gone up to 145. Sodium 138, potassium 4.7, BUN 11,  creatinine 1.2. CK has peaked at 495 with 36 MB's, troponin of 10.4.  Hemoglobin A1C is 6.2. TSH is 1.42.   Echocardiogram reveals normal LV function at the lower limits.  There was  mildly reduced aortic valve leaflet excursion with a mean gradient of 16.  EKG at discharge shows sinus rhythm with a rate of 59, inferior T waves,  right bundle branch block.   Chest x-ray showed cardiomegaly scar versus atelectasis at the left base.   DISPOSITION:  The patient is discharged in stable condition.   SUMMARY/SPECIAL INSTRUCTIONS:  Medications and labs were reviewed in detail  with the patient and his wife. A copy of his discharge summary was sent to  his primary care doctor in [place]. They know to contact our office  regarding EECP treatment, which could start in a couple of weeks. We may  want to consider Statin therapy as an outpatient. He did have a slightly  elevated LFT's this admission with an SGOT of 50 and this should be  rechecked. A lipid profile was not done during this admission and we will  try and get one before he comes back to see Dr. Alanda Amass in July.      [name], P.A.      Richard A. Alanda Amass, M.D.  Electronically Signed    LKK/MEDQ  D:  06/04/2006  T:  06/04/2006  Job:  [id number]   cc:   Gerlene Burdock A. Alanda Amass, M.D.  Fax: [phone]   Romeo Rabon  Fax: [phone]

## 2011-05-10 NOTE — Cardiovascular Report (Signed)
NAMETHORNTON, DOHRMANN NO.:  [id number]   MEDICAL RECORD NO.:  [id number]          PATIENT TYPE:  INP   LOCATION:  6522                         FACILITY:  [facility]   PHYSICIAN:  Nicki Guadalajara, M.D.     DATE OF BIRTH:  September 16, 1924   DATE OF PROCEDURE:  DATE OF DISCHARGE:  09/19/2006                            CARDIAC CATHETERIZATION   INDICATIONS:  [name] is a 77 year old patient of Dr.  Alanda Amass who is a Jehovah's witness.  He will not take blood products.  He has known coronary artery disease, status post CABG revascularization  surgery.  Catheterization in June 2007 had shown patent left coronary  grafts.  His SVG to his PDA was occluded.  He was evaluated by [facility] and  not felt to be a candidate for re-do surgery, and therefore was treated  medically.  In July 2007, he presented with syncope and underwent  pacemaker insertion.  He has experienced continued chronic angina.  He  was recently seen by Dr. Alanda Amass who felt, although very complicated,  it may be possible to attempt coronary intervention to his diffusely  diseased native right coronary artery, since he was not felt to be an  operative candidate and is now brought to the hospital for a repeat  catheterization and possible attempt at complex intervention to his  right coronary artery.   The patient receive 5 mg of Valium for conscious sedation.  The right  femoral artery was punctured, and a 6-French arterial sheath was  inserted.  Diagnostic catheterization was done utilizing 5-French FL-4  and FR-4 catheters.  The right catheter was used also for selective  angiography in the vein graft supplying the diagonal, as well as the  circumflex vessel.  A 6-French internal mammary artery catheter was used  for selective angiography into the left internal mammary artery.  This  time, angiograms were reviewed, and since the patient's RCA graft was  occluded and he had very diffuse severe sub-total  disease in his RCA.  An attempt was made at performing complex difficult intervention.   Angiomax bolus plus infusion was administered.  The patient also  received additional Plavix.  A 6-French JR-4 Vista guide with side holes  was then inserted with a demonstration of therapeutic anticoagulation, a  Asahi Pro-water balloon on a wire was initially inserted and can only be  advanced into the region of the 99% stenosis but was unable to cross.  Consequently, a 1.5 x 15-mm SciMed monorail balloon was then inserted  and advanced, in an attempt to allow for additional support with the  wire.  Low-level inflation was made at the proximal portion of this 99%  stenosis.  It is apparent that the Pro-water was unable to cross the  lesion, and this was exchanged for a Asahi medium 300-cm wire, which  again was then advanced to the RCA.  A 1.5 Maverick was again  reinserted.  Any time the wire was attempted to cross the lesion, backup  was poor.  Consequently, the system was removed, and a hockey-stick  guide with side holes was used for  additional guide support.  Asahi  medium was then re-inserted.  IC nitroglycerin was administered.  It  became apparent that there was evidence for an intimal dissection, where  the wire was trying to cross the lesion.  Ultimately, the wire was able  to be advanced into the true lumen, utilizing a balloon with Luge wire.  Multiple dilatations were made utilizing 1.5, 2.0, 2.5 balloons.  Ultimately, 3 stents were inserted in tandem fashion to cover the entire  segment and opened up the severely diffuse disease with a 3.5 x 33-mm  Cypher stent x2 and a 3.5 x 13-mm Cypher stent.  A 3.75 Quantum balloon  was used for post-stent dilatation within the entire stented segment.  The procedure was very difficult and prolonged, but ultimately an  excellent angiographic result was obtained.  The study lasted  approximately 3-1/2 half hours.  The arterial sheath was sutured in   place.  ACT was documented to be therapeutic.  The patient tolerated  procedure well.   HEMODYNAMIC DATA:  Central aortic pressure was 160/73, mean 105.   ANGIOGRAPHIC DATA:  Left main coronary artery had distal 70% stenosis  prior to bifurcating into an LAD and left circumflex system.   The LAD was totally occluded proximally.   The native circumflex had diffuse 70-70-50% stenoses, before giving rise  to the marginal to the vein graft.  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   The right coronary artery had diffuse calcification proximally and had  diffuse 99% stenoses extending to the mid-segment, followed by 40%  stenosis after an RV marginal branch, followed by another for a 30-40%  stenosis beyond the crux.  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   The vein graft supplying the right coronary artery was totally occluded  at its origin.   The LIMA graft supplying the LAD system was widely patent.  Antegrade to  this vessel where it had anastomosed, there was a 95% stenosis prior to  a twin-like LAD system, which potentially was jeopardized by the LIMA  not reaching this area.   The vein graft supplying the more proximal diagonal vessel had mild 20-  30% ostial narrowing and then 30% narrowing in the  proximal third  portion of the graft.   The vein graft supplying the intermediate vessel was angiographically  normal.   Following very difficult, protracted but ultimately successful PTCA of a  severely diffusely diseased 99% stenosed RCA with ultimate insertion of  three tandem  3.5-mm drug-eluting Cypher stents (3.5 x 33, 3.5 x 33, 3.5 x 13-mm stents) post-dilated to 3.75 mm, the entire segment was reduced  to 0%.  The RCA  was a very large caliber vessel.  The stent extended to  the region of the crux.  There was brisk TIMI III flow.  At the  completion of the procedure, there was no evidence for dissection.   IMPRESSION:  1. Severe native coronary obstructive disease with 70% distal left      main stenosis; total occlusion of the proximal LAD; diffuse 70-70-      50% stenosis in the ramus intermediate-like vessel proximal to the      graft insertion; and severely calcified and diffusely diseased RCA  with diffuse narrowing of 90 and 99%.  2. 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  3. Patent LIMA to the LAD system.  4. Patent vein graft to intermediate vessel.  5. Patent vein graft to a more proximal diagonal vessel with evidence      for 20 and 30% narrowings near the ostium and in the proximal third      of the graft.  Old occluded vein graft to the RCA.  6. Very difficult but successful PTCA stenting of the severely      diseased right coronary artery with ultimate insertion of three      tandem 3.5-mm Cypher stents post-dilated to 3.75 mm done with      double bolus Integrilin/weight-adjusted heparinization, as well as      oral Plavix.           ______________________________  Nicki Guadalajara, M.D.     TK/MEDQ  D:  12/04/2006  T:  12/04/2006  Job:  [id number]   cc:   Gerlene Burdock A. Alanda Amass, M.D.  Dr. Arlyce Dice

## 2011-09-17 LAB — BASIC METABOLIC PANEL
BUN: 21
CO2: 29
Chloride: 103
Glucose, Bld: 107 — ABNORMAL HIGH
Potassium: 4.1

## 2011-09-17 LAB — CBC
HCT: 32.5 — ABNORMAL LOW
MCV: 89.9
RBC: 3.61 — ABNORMAL LOW
RDW: 12.6
WBC: 4.4

## 2011-09-17 LAB — PROTIME-INR
INR: 1.3
Prothrombin Time: 16.1 — ABNORMAL HIGH

## 2011-09-17 LAB — LIPID PANEL
Cholesterol: 152
LDL Cholesterol: 109 — ABNORMAL HIGH
Triglycerides: 80
VLDL: 16

## 2011-09-17 LAB — CARDIAC PANEL(CRET KIN+CKTOT+MB+TROPI)
CK, MB: 1.1
Total CK: 42
Total CK: 48

## 2011-09-17 LAB — HEPARIN LEVEL (UNFRACTIONATED): Heparin Unfractionated: 0.52

## 2011-09-17 LAB — TSH: TSH: 2.397

## 2012-02-21 HISTORY — PX: PERMANENT PACEMAKER GENERATOR CHANGE: SHX6022

## 2012-04-30 HISTORY — PX: CARDIOVASCULAR STRESS TEST: SHX262

## 2013-02-18 ENCOUNTER — Other Ambulatory Visit: Payer: Self-pay | Admitting: *Deleted

## 2013-02-22 ENCOUNTER — Encounter (HOSPITAL_COMMUNITY): Payer: Self-pay | Admitting: Pharmacy Technician

## 2013-02-24 MED ORDER — VANCOMYCIN HCL IN DEXTROSE 1-5 GM/200ML-% IV SOLN
1000.0000 mg | INTRAVENOUS | Status: DC
  Filled 2013-02-24: qty 200

## 2013-02-24 MED ORDER — SODIUM CHLORIDE 0.9 % IR SOLN
80.0000 mg | Status: AC
  Filled 2013-02-24: qty 2

## 2013-02-25 ENCOUNTER — Ambulatory Visit (HOSPITAL_COMMUNITY)
Admission: RE | Admit: 2013-02-25 | Discharge: 2013-02-25 | Disposition: A | Discharge status: Home / Self Care | Payer: Medicare Other | Source: Ambulatory Visit | Attending: Cardiovascular Disease | Admitting: Cardiovascular Disease

## 2013-02-25 ENCOUNTER — Encounter (HOSPITAL_COMMUNITY)
Admission: RE | Disposition: A | Discharge status: Home / Self Care | Payer: Self-pay | Source: Ambulatory Visit | Attending: Cardiovascular Disease

## 2013-02-25 DIAGNOSIS — Z45018 Encounter for adjustment and management of other part of cardiac pacemaker: Secondary | ICD-10-CM | POA: Insufficient documentation

## 2013-02-25 HISTORY — PX: PERMANENT PACEMAKER GENERATOR CHANGE: SHX6022

## 2013-02-25 LAB — SURGICAL PCR SCREEN
MRSA, PCR: NEGATIVE
Staphylococcus aureus: POSITIVE — AB

## 2013-02-25 SURGERY — PERMANENT PACEMAKER GENERATOR CHANGE
Anesthesia: LOCAL

## 2013-02-25 MED ORDER — CHLORHEXIDINE GLUCONATE 4 % EX LIQD
60.0000 mL | Freq: Once | CUTANEOUS | Status: DC
  Filled 2013-02-25: qty 60

## 2013-02-25 MED ORDER — SODIUM CHLORIDE 0.9 % IJ SOLN: 3.0000 mL | INTRAMUSCULAR | Status: DC | PRN

## 2013-02-25 MED ORDER — MUPIROCIN 2 % EX OINT
TOPICAL_OINTMENT | CUTANEOUS | Status: AC
  Administered 2013-02-25: 1
  Filled 2013-02-25: qty 22

## 2013-02-25 MED ORDER — FENTANYL CITRATE 0.05 MG/ML IJ SOLN
INTRAMUSCULAR | Status: AC
  Filled 2013-02-25: qty 2

## 2013-02-25 MED ORDER — LIDOCAINE HCL (PF) 1 % IJ SOLN
INTRAMUSCULAR | Status: AC
  Filled 2013-02-25: qty 60

## 2013-02-25 MED ORDER — SODIUM CHLORIDE 0.9 % IV SOLN
INTRAVENOUS | Status: DC
  Administered 2013-02-25: 10:00:00 via INTRAVENOUS

## 2013-02-25 MED ORDER — MIDAZOLAM HCL 2 MG/2ML IJ SOLN
INTRAMUSCULAR | Status: AC
  Filled 2013-02-25: qty 2

## 2013-02-25 NOTE — H&P (Signed)
  Date of Initial H&P: 02/16/2013  History reviewed, patient examined, no change in status, stable for surgery. This procedure has been fully reviewed with the patient and written informed consent has been obtained. Device generator changeout for [employer].  Thurmon Fair, MD, [facility] [phone] office [phone] pager 02/25/2013

## 2013-02-25 NOTE — CV Procedure (Signed)
[name], Bhakta Male, 79 y.o., 06/27/1924  MRN: [id number]  CSN: [id number]  Admit Dt: 02/25/13  Procedure report  Procedure performed:  1. Dual chamber pacemaker generator changeout  2. Light sedation  Reason for procedure:  1. Device generator at elective replacement interval  Procedure performed by:  Thurmon Fair, MD  Complications:  None  Estimated blood loss:  <5 mL  Medications administered during procedure:  Vancomycin 1 g intravenously, lidocaine 1% 30 mL locally, fentanyl 50 mcg intravenously, Versed 1 mg intravenously Device details:   New Generator Medtronic Adapta L model number ADDRL01, serial number [id number] Right atrial lead (chronic) Medtronic, model number [id number], serial [id number] (implanted 07/21/2006) Right ventricular lead (chronic)  Medtronic, model number [id number], serial number [id number] (implanted 2007)   Explanted generator Medtronic EnRhythm,  model number [id number], serial number  [id number] (implanted 2007)  Procedure details:  After the risks and benefits of the procedure were discussed the patient provided informed consent. She was brought to the cardiac catheter lab in the fasting state. The patient was prepped and draped in usual sterile fashion. Local anesthesia with 1% lidocaine was administered to to the left infraclavicular area. A 5-6cm horizontal incision was made parallel with and 2-3 cm caudal to the left clavicle, in the area of an old scar.  Using minimal electrocautery and mostly sharp and blunt dissection the prepectoral pocket was opened carefully to avoid injury to the loops of chronic leads. Extensive dissection was not necessary. The device was explanted. The pocket was carefully inspected for hemostasis and flushed with copious amounts of antibiotic solution.  The leads were disconnected from the old generator and testing of the lead parameters later showed excellent values. The new generator was connected to the chronic leads,  with appropriate pacing noted.   The entire system was then carefully inserted in the pocket with care been taking that the leads and device assumed a comfortable position without pressure on the incision. Great care was taken that the leads be located deep to the generator. The pocket was then closed in layers using 2 layers of 2-0 Vicryl and cutaneous staples after which a sterile dressing was applied.   At the end of the procedure the following lead parameters were encountered:   Right atrial lead sensed P waves 3.6 mV, impedance 466 ohms, threshold 0.7 at 0.5 ms pulse width.  Right ventricular lead sensed R waves  10.5 mV, impedance 644 ohms, threshold 0.8 at 0.5 ms pulse width.   Thurmon Fair, MD, [facility] [phone] office [phone] pager 02/25/2013 12:13 PM

## 2013-06-07 ENCOUNTER — Telehealth: Payer: Self-pay | Admitting: Cardiovascular Disease

## 2013-06-07 NOTE — Telephone Encounter (Signed)
Samples left at front desk for pt pick up.  Lot: [id number]  Exp: 05/2015.  Call to Lavina, pt's wife, and informed.

## 2013-06-07 NOTE — Telephone Encounter (Signed)
Mr.Campanelli is wanting some samples of Zetia 10mg  .His wife is coming in today and wants her to be able to pick them up.   Thanks

## 2013-06-15 ENCOUNTER — Telehealth: Payer: Self-pay | Admitting: Cardiovascular Disease

## 2013-06-15 NOTE — Telephone Encounter (Signed)
Returned call and spoke w/ Liborio Nixon, pt's wife.     April 1st - Meds changed  April 17th - ER r/t vomiting; dx w/ gastroenteritis  April 27th - [facility] b/c not better and referred to GI  April 30th - Seen at GI; dx w/ gastro enteritis; told it can take a month to resolve  Last Wed (June 18th) "threw up 11 times"; seen by Dr. Oscar La and sent to ER  ER dx w/ gastroenteritis, diverticulitis and air in bowels; given zofran  Sunday (June 22nd) seen in ER for watery stools and not much done b/c pt was unable to give adequate stool sample; referred to GI in [place], [place] (appt July 1st)  Took stool samples back yesterday  Pt/wife think furosemide may be causing problems since pt did not have symptoms until after starting furosemide. Wife stated pt did not take furosemide and K+ today and took HCTZ and hasn't had diarrhea today.  Informed wife that nausea, vomiting, diarrhea and constipation are SEs of almost every medication and there is a possibility furosemide could be contributing to pt's symptoms.  Wife wanted to know if pt can stop both furosemide and K+ and take HCTZ.  Informed RN cannot advise pt to do that and informed Dr. Alanda Amass is out of the office for the next two weeks.  Informed an Extender can be notified to review chart and RN will follow-up after a response is given.  Wife informed of possibility that pt may need to come in for an appt and stated she would prefer if they call her b/c pt is not feeling up to coming and they live so far away.  Informed that will be taken into consideration and advice will be given as indicated by Extender.  Wife verbalized understanding and agreed w/ plan.  Also advised wife to contact Dr. Oscar La since he has seen pt and could advise on if pt can d/c furosemide/K+ and wife would like to wait for a response from Extender.

## 2013-06-15 NOTE — Telephone Encounter (Signed)
Have not been feeling well at all-wonder if it might be coming from his medicine? He is taking Lasix and Potassium-seen like he might be having some side effects!

## 2013-06-16 NOTE — Telephone Encounter (Signed)
Message forwarded to B. Leron Croak, PA-C for further instructions.  Chart# [id number] on cart.

## 2013-06-16 NOTE — Telephone Encounter (Signed)
Okay to stop Lasix and potassium. Continue with HCTZ 12.5 daily.  Monitor weight daily for increases.  If weight increases 1-2 pounds in 24 hours and 4-5 pounds in one week, restart Lasix and potassium until weight is back at baseline.  Gertha Lichtenberg 6:31 PM

## 2013-06-17 NOTE — Telephone Encounter (Signed)
Returned call and informed wife per instructions by MD/PA.  Wife verbalized understanding and agreed w/ plan.  Stated pt had lost 15 lbs and is now down 11 lbs and his appetite is coming back.  Stated it would be hard to tell the weight gain if pt's appetite is back and he starts gaining his regular weight back.  Wife advised to continue to monitor and pt should not gain 1-2 lbs in one day.  Advised to monitor for swelling and SOB or increased SOB.  Wife verbalized understanding and stated pt only takes HCTZ twice/week.  Informed Hager, PA-C advised pt can take daily.  Wife will continue HCTZ twice/weekly and increase to daily if weight gain.    Message forwarded to [facility]. Berlinda Last, LPN to discuss w/ Dr. Alanda Amass.  This note and paper chart# [id number] on Dr. Kandis Cocking cart.

## 2013-06-30 ENCOUNTER — Encounter: Payer: Self-pay | Admitting: Cardiovascular Disease

## 2013-07-12 ENCOUNTER — Telehealth: Payer: Self-pay | Admitting: Cardiovascular Disease

## 2013-07-12 NOTE — Telephone Encounter (Signed)
Pt's wife called and stated that her husbands pulse rate was 128 and he has a terrible headache. Please call her back to find out what to do.Marland KitchenMarland Kitchen

## 2013-07-13 NOTE — Telephone Encounter (Signed)
The patient and his wife last night regarding elevated blood pressure and heart rate. Other than headache the patient was asymptomatic.  I instructed the patient to take his evening metoprolol at that time. Also recommended if he noticed heart rate elevated in the 130s to 150s for a prolonged period with associated symptoms, he should go to the emergency room.    At this time the patient has improvement in blood pressure and heart rate.  He can be seen by Dr. Alanda Amass at his next available opportunity.  Make appt with Dr. Alanda Amass at next available time.   [name] 11:59 AM

## 2013-07-13 NOTE — Telephone Encounter (Signed)
Returned call.  Left message to call back before 4pm.  

## 2013-07-13 NOTE — Telephone Encounter (Signed)
Pt's wife called back.  Stated they took a nap yesterday and when pt woke up he had a HA.  Stated he said he could feel his HR.  Stated BP was 182/113 HR 123.  Next BP 168/116 HR 128.  Stated she called 911 b/c pt was in a real agitated state.  Stated pt got upset when he found out she called and she told the operator never mind.  Stated they sent police who stayed for about 15 mins.  Stated she called the office about 5:30pm and spoke w/ B. Hager, PA and was told to take metoprolol night dose right then.  Stated [place], [place] tried to get him to go to ER and pt declined.  Stated he told pt if HR not decreased by today to call for an appt.  Wife gave BP/HR as follows:  BP  HR Time 125/82  120 7pm 141/79  76 9:30pm 151/83  78 7am 142/69  76 8:30am  130/63  73 "few mins later"  Spoke w/ S. Lelon Perla, CMA with devices.  Agreed that B. Leron Croak, PA-C should be notified since he spoke w/ pt last evening for further instructions.  Wife informed and verbalized understanding.  Ok to leave message if no answer.  Understands may need appt.

## 2013-07-13 NOTE — Telephone Encounter (Signed)
Returned call and informed wife per instructions by MD/PA.  Wife verbalized understanding and agreed w/ plan.  Appt scheduled for 7.24.14 at 3:45pm w/ Dr. Alanda Amass.  Message forwarded to [facility]. Berlinda Last, LPN to discuss w/ Dr. Alanda Amass.  This note and paper chart# [id number] placed on Dr. Kandis Cocking cart.

## 2013-07-15 LAB — PACEMAKER DEVICE OBSERVATION

## 2013-07-26 ENCOUNTER — Encounter: Payer: Self-pay | Admitting: Cardiovascular Disease

## 2013-08-06 ENCOUNTER — Telehealth: Payer: Self-pay | Admitting: Cardiovascular Disease

## 2013-08-06 NOTE — Telephone Encounter (Signed)
Message forwarded to [facility]. Berlinda Last, LPN to discuss w/ Dr. Alanda Amass.  This note and paper chart# [id number] placed on Dr. Kandis Cocking cart.

## 2013-08-06 NOTE — Telephone Encounter (Signed)
Mrs. [name] is calling because her husband her had another spell and had to call 911. He is now at home , and she is calling to inform Dr. Alanda Amass of this .Marland Kitchen Please call    Thanks

## 2013-08-11 NOTE — Telephone Encounter (Signed)
Called no answer left message to call back if not feeling better

## 2013-08-16 ENCOUNTER — Telehealth: Payer: Self-pay | Admitting: Cardiovascular Disease

## 2013-08-16 NOTE — Telephone Encounter (Signed)
Returned call and spoke w/ Liborio Nixon, pt's wife.  Informed refills in the process of being completed now to both pharmacies.  Verbalized understanding.

## 2013-08-16 NOTE — Telephone Encounter (Signed)
30 day supply called to [facility] [phone].  90 day supply to [facility] sent through [employer].

## 2013-08-16 NOTE — Telephone Encounter (Signed)
Have run out of his Cartia   50 mg-She called mail order-said they had not heard from our office-Need new fax for this needs 90 days supply.This goes to [facility].Would you please call 30 days supply of the medicine in until mail order comes in.Please call to [facility]-[phone]

## 2013-08-25 ENCOUNTER — Other Ambulatory Visit: Payer: Self-pay | Admitting: *Deleted

## 2013-08-25 DIAGNOSIS — R55 Syncope and collapse: Secondary | ICD-10-CM

## 2013-08-25 DIAGNOSIS — R0989 Other specified symptoms and signs involving the circulatory and respiratory systems: Secondary | ICD-10-CM

## 2013-08-25 LAB — PACEMAKER DEVICE OBSERVATION

## 2013-09-08 ENCOUNTER — Ambulatory Visit (HOSPITAL_COMMUNITY)
Admission: RE | Admit: 2013-09-08 | Discharge: 2013-09-08 | Disposition: A | Discharge status: Home / Self Care | Payer: Medicare Other | Source: Ambulatory Visit | Attending: Cardiovascular Disease | Admitting: Cardiovascular Disease

## 2013-09-08 DIAGNOSIS — Z8673 Personal history of transient ischemic attack (TIA), and cerebral infarction without residual deficits: Secondary | ICD-10-CM | POA: Insufficient documentation

## 2013-09-08 DIAGNOSIS — R55 Syncope and collapse: Secondary | ICD-10-CM | POA: Insufficient documentation

## 2013-09-08 DIAGNOSIS — R0989 Other specified symptoms and signs involving the circulatory and respiratory systems: Secondary | ICD-10-CM

## 2013-09-08 HISTORY — PX: TRANSTHORACIC ECHOCARDIOGRAM: SHX275

## 2013-09-08 NOTE — Progress Notes (Signed)
Carotid Duplex Completed. °Brianna L Mazza,RVT °

## 2013-09-08 NOTE — Progress Notes (Signed)
2D Echo Performed 09/08/2013    Shabazz Mckey, RCS  

## 2013-09-16 ENCOUNTER — Telehealth: Payer: Self-pay | Admitting: Cardiovascular Disease

## 2013-09-16 NOTE — Telephone Encounter (Signed)
Pt. [id number] of this monthd refill on his spionolactone, i faxed and sent it in erx to make sure it got in it had already been sent in 0n the

## 2013-09-16 NOTE — Telephone Encounter (Signed)
Please call-having problems getting his medicine.

## 2013-10-20 ENCOUNTER — Telehealth: Payer: Self-pay | Admitting: Cardiovascular Disease

## 2013-10-20 NOTE — Telephone Encounter (Signed)
Returned call and pt verified x 2 w/ Liborio Nixon, pt's wife.  Stated pt has been SOB w/ exertion x 3-4 days.  Stated he feels like he did when he first had the pacemaker put in and wanted to know if it has anything to do with his pacemaker.  RN asked wife if pt is able to download.  Stated he is not b/c they do not have a land line.  Denied CP w/ SOB.  Stated exertion has been w/ walking.  Pt in background talking.  Stated he has SOB when walking and after he sits down it takes a while to be relieved.    Appt scheduled for tomorrow at 2pm w/ Corine Shelter, PA-C as pt does not want to go to ER for evaluation.  Wife agreed to take pt or call 911 to ER for any worsening symptoms.

## 2013-10-20 NOTE — Telephone Encounter (Signed)
Having SOB for 3-4 days  Feels like how he felt before he put first pacemaker in.  Wonders if something wrong with his pacemaker.  Please call

## 2013-10-21 ENCOUNTER — Encounter: Payer: Self-pay | Admitting: Cardiology

## 2013-10-21 ENCOUNTER — Ambulatory Visit (INDEPENDENT_AMBULATORY_CARE_PROVIDER_SITE_OTHER): Payer: Medicare Other | Admitting: Cardiology

## 2013-10-21 VITALS — BP 120/80 | HR 78 | Ht 63.0 in | Wt 142.0 lb

## 2013-10-21 DIAGNOSIS — R0609 Other forms of dyspnea: Secondary | ICD-10-CM | POA: Insufficient documentation

## 2013-10-21 DIAGNOSIS — Z531 Procedure and treatment not carried out because of patient's decision for reasons of belief and group pressure: Secondary | ICD-10-CM

## 2013-10-21 DIAGNOSIS — I2510 Atherosclerotic heart disease of native coronary artery without angina pectoris: Secondary | ICD-10-CM | POA: Insufficient documentation

## 2013-10-21 DIAGNOSIS — R0600 Dyspnea, unspecified: Secondary | ICD-10-CM | POA: Insufficient documentation

## 2013-10-21 DIAGNOSIS — I519 Heart disease, unspecified: Secondary | ICD-10-CM

## 2013-10-21 DIAGNOSIS — Z954 Presence of other heart-valve replacement: Secondary | ICD-10-CM

## 2013-10-21 DIAGNOSIS — I5189 Other ill-defined heart diseases: Secondary | ICD-10-CM | POA: Insufficient documentation

## 2013-10-21 DIAGNOSIS — Z950 Presence of cardiac pacemaker: Secondary | ICD-10-CM

## 2013-10-21 DIAGNOSIS — D649 Anemia, unspecified: Secondary | ICD-10-CM | POA: Insufficient documentation

## 2013-10-21 DIAGNOSIS — Z952 Presence of prosthetic heart valve: Secondary | ICD-10-CM | POA: Insufficient documentation

## 2013-10-21 MED ORDER — HYDROCHLOROTHIAZIDE 12.5 MG PO TABS: 25.0000 mg | ORAL_TABLET | Freq: Every day | ORAL | Status: DC

## 2013-10-21 MED ORDER — ISOSORBIDE MONONITRATE ER 30 MG PO TB24: 15.0000 mg | ORAL_TABLET | Freq: Every day | ORAL | Status: DC

## 2013-10-21 NOTE — Assessment & Plan Note (Signed)
Recent labs OK per pt

## 2013-10-21 NOTE — Progress Notes (Signed)
10/21/2013 [name]   [birth year]  [id number]  Primary Physicia Romeo Rabon, MD Primary Cardiologist: Dr Royann Shivers  HPI:  79 y/o Jehovah's witness with a history of a "bloodless" CABG x 4 and tissue AVR in 2004. He had the native RCA stented in 2007. At that time the LIMA-LAD, SVG-Dx, SVG-OM were patent, the SVG-RCA was occluded. He recently saw Dr Alanda Amass after a near syncopal spell. Echo done Sept 2014 showed grade 1 diastolic dysfunction and an EF of 50-55%. Carotid doppler showed moderate RICA disease. He is here today with complaints of DOE. He has not had angina. He denies orthopnea or pnd.    Current Outpatient Prescriptions  Medication Sig Dispense Refill  . clopidogrel (PLAVIX) 75 MG tablet Take 75 mg by mouth every morning.      . diltiazem (CARDIZEM CD) 180 MG 24 hr capsule Take 180 mg by mouth daily.      Marland Kitchen ezetimibe (ZETIA) 10 MG tablet Take 10 mg by mouth every morning.      . hydrochlorothiazide (HYDRODIURIL) 12.5 MG tablet Take 2 tablets (25 mg total) by mouth daily.      . metoprolol (LOPRESSOR) 50 MG tablet Take 50 mg by mouth 2 (two) times daily.      . nitroGLYCERIN (NITROSTAT) 0.4 MG SL tablet Place 0.4 mg under the tongue every 5 (five) minutes as needed for chest pain.      Marland Kitchen omeprazole (PRILOSEC) 20 MG capsule Take 20 mg by mouth every morning.      Marland Kitchen spironolactone (ALDACTONE) 25 MG tablet Take 25 mg by mouth every morning.      . Vitamin D, Ergocalciferol, (DRISDOL) 50000 UNITS CAPS Take 50,000 Units by mouth 2 (two) times a week. Takes 1 capsule on Mondays mornings and 1 capsule on Friday mornings.      Marland Kitchen aspirin EC 81 MG tablet Take 81 mg by mouth every morning.      . fluorouracil (EFUDEX) 5 % cream Apply 1 application topically 2 (two) times daily. Applies to nose.      . isosorbide mononitrate (IMDUR) 30 MG 24 hr tablet Take 0.5 tablets (15 mg total) by mouth daily.  90 tablet  3   No current facility-administered medications for this visit.     Allergies  Allergen Reactions  . Amiodarone Diarrhea and Nausea And Vomiting  . Keflex [Cephalexin] Nausea And Vomiting  . Penicillins Swelling  . Statins Other (See Comments)    "leg pain"  . Sular [Nisoldipine Er] Other (See Comments)    "stopped heartbeat"     History   Social History  . Marital Status: Married    Spouse Name: N/A    Number of Children: N/A  . Years of Education: N/A   Occupational History  . Not on file.   Social History Main Topics  . Smoking status: Former Smoker    Quit date: 10/21/1950  . Smokeless tobacco: Not on file  . Alcohol Use: Not on file  . Drug Use: Not on file  . Sexual Activity: Not on file   Other Topics Concern  . Not on file   Social History Narrative  . No narrative on file     Review of Systems: General: negative for chills, fever, night sweats or weight changes.  Cardiovascular: negative for chest pain, dyspnea on exertion, edema, orthopnea, palpitations, paroxysmal nocturnal dyspnea or shortness of breath Dermatological: negative for rash Respiratory: negative for cough or wheezing Urologic: negative for hematuria Abdominal: negative for nausea,  vomiting, diarrhea, bright red blood per rectum, melena, or hematemesis Neurologic: negative for visual changes, syncope, or dizziness All other systems reviewed and are otherwise negative except as noted above.    Blood pressure 120/80, pulse 78, height 5\' 3"  (1.6 m), weight 142 lb (64.411 kg).  General appearance: alert, cooperative and no distress Neck: no carotid bruit and RCA bruit Lungs: clear to auscultation bilaterally Heart: regular rate and rhythm and soft systolic murmur Extremities: no edema  EKG NSR, paced, LBBB  ASSESSMENT AND PLAN:   DOE (dyspnea on exertion) Primary complaint- Sxs X 4 days  Diastolic dysfunction, grade 1 with EF 50-55% Sept 2014 .  CAD- CABG X 4 '04. PCI/DES '07, low risk Myoview 5/13 No chest pain  Pacemaker- MDT '07, gen  change March 2014 .  Anemia- chronic Recent labs OK per pt  Aortic valve prosthesis present- Tissue AVR '04 .   PLAN  I suspect [name] has early diastolic CHF. I asked him to increase his HCTZ to 25 mg daily and added Imdur 15 mg. I'll see him back next week.  Nykira Reddix KPA-C 10/21/2013 4:23 PM

## 2013-10-21 NOTE — Patient Instructions (Signed)
Imdur 15 mg daily. Increase HCTZ to 25 mg daily. Return to clinic next week.

## 2013-10-21 NOTE — Assessment & Plan Note (Signed)
No chest pain

## 2013-10-21 NOTE — Assessment & Plan Note (Signed)
Primary complaint- Sxs X 4 days

## 2013-10-26 ENCOUNTER — Encounter: Payer: Self-pay | Admitting: Cardiology

## 2013-10-26 ENCOUNTER — Telehealth: Payer: Self-pay | Admitting: Internal Medicine

## 2013-10-26 NOTE — Telephone Encounter (Signed)
Pt has jan recall with dr c/mt °

## 2013-10-27 ENCOUNTER — Ambulatory Visit (INDEPENDENT_AMBULATORY_CARE_PROVIDER_SITE_OTHER): Payer: Medicare Other | Admitting: Cardiology

## 2013-10-27 ENCOUNTER — Encounter: Payer: Self-pay | Admitting: Cardiology

## 2013-10-27 VITALS — BP 100/60 | HR 78 | Ht 63.0 in | Wt 142.0 lb

## 2013-10-27 DIAGNOSIS — I5189 Other ill-defined heart diseases: Secondary | ICD-10-CM

## 2013-10-27 DIAGNOSIS — Z954 Presence of other heart-valve replacement: Secondary | ICD-10-CM

## 2013-10-27 DIAGNOSIS — R0609 Other forms of dyspnea: Secondary | ICD-10-CM

## 2013-10-27 DIAGNOSIS — R0600 Dyspnea, unspecified: Secondary | ICD-10-CM

## 2013-10-27 DIAGNOSIS — IMO0001 Reserved for inherently not codable concepts without codable children: Secondary | ICD-10-CM

## 2013-10-27 DIAGNOSIS — N289 Disorder of kidney and ureter, unspecified: Secondary | ICD-10-CM

## 2013-10-27 DIAGNOSIS — Z952 Presence of prosthetic heart valve: Secondary | ICD-10-CM

## 2013-10-27 DIAGNOSIS — Z531 Procedure and treatment not carried out because of patient's decision for reasons of belief and group pressure: Secondary | ICD-10-CM

## 2013-10-27 DIAGNOSIS — I519 Heart disease, unspecified: Secondary | ICD-10-CM

## 2013-10-27 DIAGNOSIS — I2510 Atherosclerotic heart disease of native coronary artery without angina pectoris: Secondary | ICD-10-CM

## 2013-10-27 DIAGNOSIS — Z950 Presence of cardiac pacemaker: Secondary | ICD-10-CM

## 2013-10-27 MED ORDER — HYDROCHLOROTHIAZIDE 25 MG PO TABS: 25.0000 mg | ORAL_TABLET | Freq: Every day | ORAL | Status: DC

## 2013-10-27 MED ORDER — ISOSORBIDE MONONITRATE ER 30 MG PO TB24: 15.0000 mg | ORAL_TABLET | Freq: Every day | ORAL | Status: DC

## 2013-10-27 NOTE — Patient Instructions (Signed)
Continue current medications, have lab work done in [place] with results sent to [place] here. Dr Royann Shivers in one month

## 2013-10-27 NOTE — Progress Notes (Signed)
10/27/2013 [name]   October 27, 1924  [id number]  Primary Physicia Romeo Rabon, MD Primary Cardiologist: Dr Royann Shivers  HPI:  79 y/o Jehovah's witness with a history of a "bloodless" CABG x 4 and tissue AVR in 2004. He had the native RCA stented in 2007. At that time the LIMA-LAD, SVG-Dx, SVG-OM were patent, the SVG-RCA was occluded. Echo done Sept 2014 showed grade 1 diastolic dysfunction and an EF of 50-55%. Carotid doppler showed moderate RICA disease. He was seen 10/21/13 with complaints of DOE. He has not had angina. He denies orthopnea or pnd. I increased his HCTZ to 25 mg daily and added low dose Imdur as I suspected he had mild diastolic CHF. He is here today for follow up. He reports that he is feeling much better.     Current Outpatient Prescriptions  Medication Sig Dispense Refill  . clopidogrel (PLAVIX) 75 MG tablet Take 75 mg by mouth every morning.      . diltiazem (CARDIZEM CD) 180 MG 24 hr capsule Take 180 mg by mouth daily.      Marland Kitchen ezetimibe (ZETIA) 10 MG tablet Take 10 mg by mouth every morning.      . fluorouracil (EFUDEX) 5 % cream Apply 1 application topically 2 (two) times daily. Applies to nose.      . metoprolol (LOPRESSOR) 50 MG tablet Take 50 mg by mouth 2 (two) times daily.      . nitroGLYCERIN (NITROSTAT) 0.4 MG SL tablet Place 0.4 mg under the tongue every 5 (five) minutes as needed for chest pain.      Marland Kitchen omeprazole (PRILOSEC) 20 MG capsule Take 20 mg by mouth every morning.      Marland Kitchen spironolactone (ALDACTONE) 25 MG tablet Take 25 mg by mouth every morning.      . Vitamin D, Ergocalciferol, (DRISDOL) 50000 UNITS CAPS Take 50,000 Units by mouth 2 (two) times a week. Takes 1 capsule on Mondays mornings and 1 capsule on Friday mornings.      Marland Kitchen aspirin EC 81 MG tablet Take 81 mg by mouth every morning.      . hydrochlorothiazide (HYDRODIURIL) 25 MG tablet Take 1 tablet (25 mg total) by mouth daily.  90 tablet  3  . isosorbide mononitrate (IMDUR) 30 MG 24 hr tablet Take  0.5 tablets (15 mg total) by mouth daily.  45 tablet  3   No current facility-administered medications for this visit.    Allergies  Allergen Reactions  . Amiodarone Diarrhea and Nausea And Vomiting  . Keflex [Cephalexin] Nausea And Vomiting  . Penicillins Swelling  . Statins Other (See Comments)    "leg pain"  . Sular [Nisoldipine Er] Other (See Comments)    "stopped heartbeat"     History   Social History  . Marital Status: Married    Spouse Name: N/A    Number of Children: N/A  . Years of Education: N/A   Occupational History  . retired     Personnel officer   Social History Main Topics  . Smoking status: Former Smoker    Quit date: 10/21/1950  . Smokeless tobacco: Not on file  . Alcohol Use: Not on file  . Drug Use: Not on file  . Sexual Activity: Not on file   Other Topics Concern  . Not on file   Social History Narrative  . No narrative on file     Review of Systems: General: negative for chills, fever, night sweats or weight changes.  Cardiovascular: negative for  chest pain, dyspnea on exertion, edema, orthopnea, palpitations, paroxysmal nocturnal dyspnea or shortness of breath Dermatological: negative for rash Respiratory: negative for cough or wheezing Urologic: negative for hematuria Abdominal: negative for nausea, vomiting, diarrhea, bright red blood per rectum, melena, or hematemesis Neurologic: negative for visual changes, syncope, or dizziness All other systems reviewed and are otherwise negative except as noted above.    Blood pressure 100/60, pulse 78, height 5\' 3"  (1.6 m), weight 142 lb (64.411 kg).  General appearance: alert, cooperative and no distress Lungs: few crackles Rt base Heart: regular rate and rhythm Extremities: trace edema   ASSESSMENT AND PLAN:   DOE (dyspnea on exertion) Improved with increase in HCTZ and addition of low dose nitrate  Diastolic dysfunction, grade 1 with EF 50-55% Sept 2014 .  Aortic valve prosthesis  present- Tissue AVR '04 .  Refusal of blood transfusions as patient is Jehovah's Witness .  Pacemaker- MDT '07, gen change March 2014 .  CAD- CABG X 4 '04. PCI/DES '07, low risk Myoview 5/13 .    PLAN  Continue same Rx. He brought labs from [place] with him that were done in Oct. I would like him to get a follow up BMP in a week, (he prefers to have his lab drawn in [place]), and have him see Dr Royann Shivers ( his new assigned cardiologist) in a month.  Candice Tobey KPA-C 10/27/2013 2:25 PM

## 2013-10-27 NOTE — Assessment & Plan Note (Signed)
Improved with increase in HCTZ and addition of low dose nitrate

## 2013-11-29 ENCOUNTER — Encounter: Payer: Self-pay | Admitting: Cardiovascular Disease

## 2013-11-29 ENCOUNTER — Ambulatory Visit (INDEPENDENT_AMBULATORY_CARE_PROVIDER_SITE_OTHER): Payer: Medicare Other | Admitting: Cardiovascular Disease

## 2013-11-29 VITALS — BP 110/80 | HR 85 | Ht 63.0 in | Wt 140.4 lb

## 2013-11-29 DIAGNOSIS — I2510 Atherosclerotic heart disease of native coronary artery without angina pectoris: Secondary | ICD-10-CM

## 2013-11-29 DIAGNOSIS — I4891 Unspecified atrial fibrillation: Secondary | ICD-10-CM

## 2013-11-29 DIAGNOSIS — Z952 Presence of prosthetic heart valve: Secondary | ICD-10-CM

## 2013-11-29 DIAGNOSIS — Z950 Presence of cardiac pacemaker: Secondary | ICD-10-CM

## 2013-11-29 DIAGNOSIS — I495 Sick sinus syndrome: Secondary | ICD-10-CM

## 2013-11-29 DIAGNOSIS — Z954 Presence of other heart-valve replacement: Secondary | ICD-10-CM

## 2013-11-29 DIAGNOSIS — I480 Paroxysmal atrial fibrillation: Secondary | ICD-10-CM

## 2013-11-29 LAB — PACEMAKER DEVICE OBSERVATION

## 2013-11-29 NOTE — Patient Instructions (Addendum)
Remote monitoring is used to monitor your pacemaker from home. This monitoring reduces the number of office visits required to check your device to one time per year. It allows us to keep an eye on the functioning of your device to ensure it is working properly. You are scheduled for a device check from home on 03-02-2014. You may send your transmission at any time that day. If you have a wireless device, the transmission will be sent automatically. After your physician reviews your transmission, you will receive a postcard with your next transmission date.  Your physician recommends that you schedule a follow-up appointment in: 12 months    

## 2013-11-30 LAB — MDC_IDC_ENUM_SESS_TYPE_INCLINIC
Battery Remaining Longevity: 12
Brady Statistic AS VP Percent: 0.1 % — CL
Brady Statistic AS VS Percent: 0.2 %
Lead Channel Impedance Value: 754 Ohm
Lead Channel Pacing Threshold Pulse Width: 0.4 ms
Lead Channel Sensing Intrinsic Amplitude: 2.8 mV
Lead Channel Sensing Intrinsic Amplitude: 8 mV
Lead Channel Setting Pacing Amplitude: 1.75 V
Lead Channel Setting Sensing Sensitivity: 2.8 mV

## 2013-12-05 DIAGNOSIS — I480 Paroxysmal atrial fibrillation: Secondary | ICD-10-CM | POA: Insufficient documentation

## 2013-12-05 NOTE — Assessment & Plan Note (Signed)
Normal device function he is a good candidate for remote monitoring. He is enrolled in [place]

## 2013-12-05 NOTE — Assessment & Plan Note (Addendum)
Note recorded event since July. 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 I suspect these are the reasons for which he has not been placed on full anticoagulation. As far as I know he has never had an episode of TIA or a stroke or other embolic events. We discussed the fact that aspirin and clopidogrel, while beneficial for stroke prevention, are not as effective as warfarin or a novel anticoagulant.

## 2013-12-05 NOTE — Assessment & Plan Note (Signed)
No angina pectoris. NYHA functional class II dyspnea on exertion is a chronic abnormality. He has evidence of a nontransmural infarction in the right coronary artery territory. EF is borderline at 50-55%

## 2013-12-05 NOTE — Assessment & Plan Note (Signed)
Normal prosthetic valve. Reevaluate next year - the device is greater than 77 years old at this point.

## 2013-12-05 NOTE — Progress Notes (Signed)
Patient ID: [name], male   DOB: [birth year], 79 y.o.   MRN: [id number]      Reason for office visit Valvular heart disease (s/p bioprosthetic AVR), coronary artery disease status post CABG, pacemaker  Mr. Vi is here to establish new cardiology followup after Dr. Kandis Cocking retirement. I met him when he underwent a pacemaker generator change out in March of this year.  He has a long-standing history of multiple cardiac problems. In 2004 he underwent multivessel bypass surgery (LIMA to LAD, SVG to diagonal, SVG to circumflex, SVG to RCA) and received a 21 mm pericardal aortic valve prosthesis, Dr. Donata Clay. 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 He had postoperative atrial fibrillation.  In 2007 he had right coronary artery graft disease that required stenting with 3 consecutive Cypher drug-eluting stents all 3.5 in diameter, cumulative length of 79 mm.  The last echo evaluation of his prosthetic valve was in September of this year. The peak and mean gradients were excellent at 13 and 8 mm Hg respectively. There was no significant aortic insufficiency. Left ventricular systolic function was borderline low at 50-55% with inferior and inferolateral hypokinesis.  That same year he received a dual-chamber permanent pacemaker for sinus bradycardia. He received a new generator Radiation protection practitioner) in March of 2014. His was most recently checked in September. Repeat check today shows normal device function. He has had atrial fibrillation rapid ventricular response lasting for several hours in July of this year. At today's check, the longest episode of mode switch was less than 30 seconds in duration. There is virtually 100% atrial pacing and virtually no ventricular pacing. Today's rhythm is atrial paced ventricular sensed with fairly frequent PVCs  Additional problems include hyperlipidemia and hypertension both well managed with  pharmacological means.  He has occasional problems with ankle edema, dizziness and very mild shortness of breath with exertion. None of these are changed from his usual pattern. He has not had any   Allergies  Allergen Reactions  . Amiodarone Diarrhea and Nausea And Vomiting  . Celebrex [Celecoxib]   . Hytrin [Terazosin]   . Indocin [Indomethacin]   . Keflex [Cephalexin] Nausea And Vomiting  . Norvasc [Amlodipine Besylate]   . Penicillins Swelling  . Procardia [Nifedipine]   . Statins Other (See Comments)    "leg pain"  . Sular [Nisoldipine Er] Other (See Comments)    "stopped heartbeat"   . Tekturna [Aliskiren]   . Vasotec [Enalapril]     Current Outpatient Prescriptions  Medication Sig Dispense Refill  . clopidogrel (PLAVIX) 75 MG tablet Take 75 mg by mouth every morning.      . diltiazem (CARDIZEM CD) 180 MG 24 hr capsule Take 180 mg by mouth daily.      Marland Kitchen ezetimibe (ZETIA) 10 MG tablet Take 10 mg by mouth every morning.      . fluorouracil (EFUDEX) 5 % cream Apply 1 application topically 2 (two) times daily. Applies to nose.      . hydrochlorothiazide (HYDRODIURIL) 25 MG tablet Take 1 tablet (25 mg total) by mouth daily.  90 tablet  3  . isosorbide mononitrate (IMDUR) 30 MG 24 hr tablet Take 0.5 tablets (15 mg total) by mouth daily.  45 tablet  3  . metFORMIN (GLUCOPHAGE) 500 MG tablet Take 500 mg by mouth daily with breakfast.      . metoprolol (LOPRESSOR) 50 MG tablet Take 50 mg by mouth 2 (two)  times daily.      . nitroGLYCERIN (NITROSTAT) 0.4 MG SL tablet Place 0.4 mg under the tongue every 5 (five) minutes as needed for chest pain.      Marland Kitchen omeprazole (PRILOSEC) 20 MG capsule Take 20 mg by mouth every morning.      Marland Kitchen spironolactone (ALDACTONE) 25 MG tablet Take 25 mg by mouth every morning.      . Vitamin D, Ergocalciferol, (DRISDOL) 50000 UNITS CAPS Take 50,000 Units by mouth 2 (two) times a week. Takes 1 capsule on Mondays mornings and 1 capsule on Friday mornings.        No current facility-administered medications for this visit.    Past Medical History  Diagnosis Date  . CAD (coronary artery disease) 2004    CABG X 4  . Aortic stenosis 2004    tissue AVR  . Refusal of blood transfusions as patient is Jehovah's Witness   . Pacemaker 2007, March 2013    MDT  . HTN (hypertension)   . Anemia, chronic disease   . History of nephrolithiasis   . RBBB     Past Surgical History  Procedure Laterality Date  . Coronary artery bypass graft  2004    LIMA-LAD; VT-diagonal; VG-circumflex; VG-RCA  . Aortic valve replacement  2004  . Coronary angioplasty with stent placement  2007    Occluded DG-RCA ,Native RCA, overlapping DES stents  . Pacemaker insertion  2007  . Permanent pacemaker generator change  March 2013    MDT  . Cholecystectomy  1993  . Transthoracic echocardiogram  09/08/13     EF 50-55% moderate hypokinesis of the inferior lateral myocardium, grade 1 diastolic dysfunction, aortic valve bioprosthetic with mild regurg valve area 1.7 mild mitral regurg   . Cardiovascular stress test  04/30/2012    EF 49%, inferior lateral hypokinesis-akinesis no reversible ischemia, large fixed inferior lateral scar  . Dental restoration/extraction with x-ray  1965    Family History  Problem Relation Age of Onset  . Cancer Mother   . Heart disease Mother   . Heart disease Father   . Pneumonia Sister   . Heart disease Brother     History   Social History  . Marital Status: Married    Spouse Name: N/A    Number of Children: N/A  . Years of Education: N/A   Occupational History  . retired     Personnel officer   Social History Main Topics  . Smoking status: Former Smoker    Quit date: 10/21/1950  . Smokeless tobacco: Not on file  . Alcohol Use: Not on file  . Drug Use: Not on file  . Sexual Activity: Not on file   Other Topics Concern  . Not on file   Social History Narrative  . No narrative on file    Review of systems: The patient  specifically denies any chest pain at rest or with exertion, dyspnea at rest or with usual exertion (NYHA class II), orthopnea, paroxysmal nocturnal dyspnea, syncope, palpitations, focal neurological deficits, intermittent claudication,  unexplained weight gain, cough, hemoptysis or wheezing.  He has mild ankle edema and mild orthostatic dizziness  The patient also denies abdominal pain, nausea, vomiting, dysphagia, diarrhea, constipation, polyuria, polydipsia, dysuria, hematuria, frequency, urgency, abnormal bleeding or bruising, fever, chills, unexpected weight changes, mood swings, change in skin or hair texture, change in voice quality, auditory or visual problems, allergic reactions or rashes, new musculoskeletal complaints other than usual "aches and pains".   PHYSICAL  EXAM BP 110/80  Pulse 85  Ht 5\' 3"  (1.6 m)  Wt 140 lb 6.4 oz (63.685 kg)  BMI 24.88 kg/m2  General: Alert, oriented x3, no distress Head: no evidence of trauma, PERRL, EOMI, no exophtalmos or lid lag, no myxedema, no xanthelasma; normal ears, nose and oropharynx Neck: normal jugular venous pulsations and no hepatojugular reflux; brisk carotid pulses without delay and no carotid bruits Chest: clear to auscultation, no signs of consolidation by percussion or palpation, normal fremitus, symmetrical and full respiratory excursions Cardiovascular: normal position and quality of the apical impulse, regular rhythm, normal first and second heart sounds, no murmurs, rubs or gallops Abdomen: no tenderness or distention, no masses by palpation, no abnormal pulsatility or arterial bruits, normal bowel sounds, no hepatosplenomegaly Extremities: no clubbing, cyanosis or edema; 2+ radial, ulnar and brachial pulses bilaterally; 2+ right femoral, posterior tibial and dorsalis pedis pulses; 2+ left femoral, posterior tibial and dorsalis pedis pulses; no subclavian or femoral bruits Neurological: grossly nonfocal   EKG: Paced ventricular  sensed, right bundle branch block, old inferior infarction, frequent PVCs, T wave inversion in leads V4 through V6 is unchanged from previous tracings  Lipid Panel     Component Value Date/Time   CHOL  Value: 152        ATP III CLASSIFICATION:  <200     mg/dL   Desirable  161-096  mg/dL   Borderline High  >=045    mg/dL   High 03/31/8118 1478   TRIG 80 04/09/2008 0458   HDL 27* 04/09/2008 0458   CHOLHDL 5.6 04/09/2008 0458   VLDL 16 04/09/2008 0458   LDLCALC  Value: 109        Total Cholesterol/HDL:CHD Risk Coronary Heart Disease Risk Table                     Men   Women  1/2 Average Risk   3.4   3.3* 04/09/2008 0458    BMET    Component Value Date/Time   NA 140 04/09/2008 0458   K 4.1 04/09/2008 0458   CL 103 04/09/2008 0458   CO2 29 04/09/2008 0458   GLUCOSE 107* 04/09/2008 0458   BUN 21 04/09/2008 0458   CREATININE 1.08 04/09/2008 0458   CALCIUM 8.7 04/09/2008 0458   GFRNONAA >60 04/09/2008 0458   GFRAA  Value: >60        The eGFR has been calculated using the MDRD equation. This calculation has not been validated in all clinical 04/09/2008 0458     ASSESSMENT AND PLAN Pacemaker- MDT '07, gen change March 2014 Normal device function he is a good candidate for remote monitoring. He is enrolled in [facility]  CAD- CABG X 4 '04. PCI/DES '07, low risk Myoview 5/13 No angina pectoris. NYHA functional class II dyspnea on exertion is a chronic abnormality. He has evidence of a nontransmural infarction in the right coronary artery territory. EF is borderline at 50-55%  Aortic valve prosthesis present- Tissue AVR '04 Normal prosthetic valve. Reevaluate next year - the device is greater than 6 years old at this point.  PAF (paroxysmal atrial fibrillation) Note recorded event since July. 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 I suspect these are the reasons for which he has not been placed on full anticoagulation. As far as I know he has never had an episode of TIA or a stroke or other  embolic events. We discussed the fact that aspirin and clopidogrel, while beneficial for stroke  prevention, are not as effective as warfarin or a novel anticoagulant.   Patient Instructions  Remote monitoring is used to monitor your pacemaker from home. This monitoring reduces the number of office visits required to check your device to one time per year. It allows [place] to keep an eye on the functioning of your device to ensure it is working properly. You are scheduled for a device check from home on 03-02-2014. You may send your transmission at any time that day. If you have a wireless device, the transmission will be sent automatically. After your physician reviews your transmission, you will receive a postcard with your next transmission date.  Your physician recommends that you schedule a follow-up appointment in: 12 months      He has had previous episodes of unusual increase in blood pressure and irascible behavior that appeared to be triggered by atrial fibrillation. Having the remote monitor will hopefully allow for more prompt diagnosis.  Orders Placed This Encounter  Procedures  . Implantable device check  . EKG 12-Lead   Meds ordered this encounter  Medications  . metFORMIN (GLUCOPHAGE) 500 MG tablet    Sig: Take 500 mg by mouth daily with breakfast.    Junious Silk, MD, [facility] [phone] office [phone] pager

## 2013-12-09 ENCOUNTER — Encounter: Payer: Self-pay | Admitting: Cardiovascular Disease

## 2013-12-22 ENCOUNTER — Telehealth: Payer: Self-pay | Admitting: Cardiovascular Disease

## 2013-12-22 NOTE — Telephone Encounter (Signed)
Has gotten approval and accessory that connects to monitor  (Does not have monitor) Please call

## 2013-12-28 NOTE — Telephone Encounter (Signed)
[facility] box mailed. Patient aware.

## 2014-01-25 ENCOUNTER — Telehealth: Payer: Self-pay | Admitting: Cardiovascular Disease

## 2014-01-25 MED ORDER — EZETIMIBE 10 MG PO TABS: 10.0000 mg | ORAL_TABLET | Freq: Every day | ORAL | Status: DC

## 2014-01-25 NOTE — Telephone Encounter (Signed)
Returned call and wife informed samples left at front desk.  Verbalized understanding and agreed w/ plan.    

## 2014-01-25 NOTE — Telephone Encounter (Signed)
Would like some samples of Zetia 10 mg please. °

## 2014-03-02 ENCOUNTER — Ambulatory Visit (INDEPENDENT_AMBULATORY_CARE_PROVIDER_SITE_OTHER): Payer: Medicare Other | Admitting: *Deleted

## 2014-03-02 DIAGNOSIS — Z950 Presence of cardiac pacemaker: Secondary | ICD-10-CM

## 2014-03-02 DIAGNOSIS — I4891 Unspecified atrial fibrillation: Secondary | ICD-10-CM

## 2014-03-02 DIAGNOSIS — I480 Paroxysmal atrial fibrillation: Secondary | ICD-10-CM

## 2014-03-02 LAB — MDC_IDC_ENUM_SESS_TYPE_REMOTE
Battery Remaining Longevity: 139 mo
Battery Voltage: 2.78 V
Lead Channel Pacing Threshold Amplitude: 0.875 V
Lead Channel Pacing Threshold Amplitude: 0.875 V
Lead Channel Pacing Threshold Pulse Width: 0.4 ms
Lead Channel Sensing Intrinsic Amplitude: 8 mV
Lead Channel Setting Pacing Amplitude: 1.75 V
Lead Channel Setting Pacing Pulse Width: 0.4 ms
Lead Channel Setting Sensing Sensitivity: 4 mV
MDC IDC MSMT BATTERY IMPEDANCE: 133 Ohm
MDC IDC MSMT LEADCHNL RA IMPEDANCE VALUE: 388 Ohm
MDC IDC MSMT LEADCHNL RA PACING THRESHOLD PULSEWIDTH: 0.4 ms
MDC IDC MSMT LEADCHNL RV IMPEDANCE VALUE: 656 Ohm
MDC IDC SESS DTM: 20150316140708
MDC IDC SET LEADCHNL RV PACING AMPLITUDE: 2 V
MDC IDC STAT BRADY AP VP PERCENT: 0 %
MDC IDC STAT BRADY AP VS PERCENT: 99 %
MDC IDC STAT BRADY AS VP PERCENT: 0 %
MDC IDC STAT BRADY AS VS PERCENT: 0 %

## 2014-03-02 LAB — PACEMAKER DEVICE OBSERVATION

## 2014-03-25 ENCOUNTER — Encounter: Payer: Self-pay | Admitting: *Deleted

## 2014-03-28 ENCOUNTER — Telehealth: Payer: Self-pay | Admitting: Cardiovascular Disease

## 2014-03-28 MED ORDER — EZETIMIBE 10 MG PO TABS
10.0000 mg | ORAL_TABLET | Freq: Every day | ORAL | Status: DC
Start: 2014-03-28 — End: 2014-04-12

## 2014-03-28 NOTE — Telephone Encounter (Signed)
He would like samples of his Zetia 10 mg please.

## 2014-03-28 NOTE — Telephone Encounter (Signed)
Returned call and wife informed samples left at front desk.  Verbalized understanding and agreed w/ plan.    

## 2014-04-12 ENCOUNTER — Telehealth: Payer: Self-pay | Admitting: Cardiovascular Disease

## 2014-04-12 MED ORDER — EZETIMIBE 10 MG PO TABS: 10.0000 mg | ORAL_TABLET | Freq: Every day | ORAL | Status: DC

## 2014-04-12 NOTE — Telephone Encounter (Signed)
Returned call and wife informed samples left at front desk.  Verbalized understanding and agreed w/ plan.    

## 2014-04-12 NOTE — Telephone Encounter (Signed)
Is asking for samples of Zeita 10mg  .. Please call

## 2014-06-06 ENCOUNTER — Telehealth: Payer: Self-pay | Admitting: Cardiology

## 2014-06-06 ENCOUNTER — Encounter: Payer: Medicare Other | Admitting: *Deleted

## 2014-06-06 NOTE — Telephone Encounter (Signed)
Spoke with pt and reminded pt of remote transmission that is due today. Pt verbalized understanding.   

## 2014-06-07 ENCOUNTER — Encounter: Payer: Self-pay | Admitting: Cardiology

## 2014-06-13 ENCOUNTER — Ambulatory Visit (INDEPENDENT_AMBULATORY_CARE_PROVIDER_SITE_OTHER): Payer: Medicare Other | Admitting: *Deleted

## 2014-06-13 ENCOUNTER — Telehealth: Payer: Self-pay | Admitting: Cardiovascular Disease

## 2014-06-13 DIAGNOSIS — Z950 Presence of cardiac pacemaker: Secondary | ICD-10-CM

## 2014-06-13 DIAGNOSIS — I480 Paroxysmal atrial fibrillation: Secondary | ICD-10-CM

## 2014-06-13 DIAGNOSIS — I4891 Unspecified atrial fibrillation: Secondary | ICD-10-CM

## 2014-06-13 NOTE — Telephone Encounter (Signed)
New message ° ° ° ° ° ° °Did we get his remote transmission? °

## 2014-06-13 NOTE — Progress Notes (Signed)
Remote pacemaker transmission.   

## 2014-06-14 NOTE — Telephone Encounter (Signed)
LMOVM informing pt that we did receive transmission.  

## 2014-06-20 LAB — MDC_IDC_ENUM_SESS_TYPE_REMOTE
Battery Impedance: 156 Ohm
Battery Voltage: 2.79 V
Brady Statistic AP VP Percent: 1 %
Brady Statistic AP VS Percent: 99 %
Brady Statistic AS VS Percent: 0 %
Date Time Interrogation Session: 20150622133212
Lead Channel Impedance Value: 398 Ohm
Lead Channel Impedance Value: 667 Ohm
Lead Channel Pacing Threshold Pulse Width: 0.4 ms
Lead Channel Pacing Threshold Pulse Width: 0.4 ms
Lead Channel Setting Pacing Amplitude: 2 V
Lead Channel Setting Pacing Pulse Width: 0.4 ms
MDC IDC MSMT BATTERY REMAINING LONGEVITY: 127 mo
MDC IDC MSMT LEADCHNL RA PACING THRESHOLD AMPLITUDE: 1.125 V
MDC IDC MSMT LEADCHNL RV PACING THRESHOLD AMPLITUDE: 0.875 V
MDC IDC MSMT LEADCHNL RV SENSING INTR AMPL: 22.4 mV
MDC IDC SET LEADCHNL RA PACING AMPLITUDE: 2.25 V
MDC IDC SET LEADCHNL RV SENSING SENSITIVITY: 4 mV
MDC IDC STAT BRADY AS VP PERCENT: 0 %

## 2014-07-04 ENCOUNTER — Other Ambulatory Visit: Payer: Self-pay | Admitting: *Deleted

## 2014-07-04 MED ORDER — DILTIAZEM HCL ER COATED BEADS 180 MG PO CP24: 180.0000 mg | ORAL_CAPSULE | Freq: Every day | ORAL | Status: DC

## 2014-07-04 MED ORDER — SPIRONOLACTONE 25 MG PO TABS: 25.0000 mg | ORAL_TABLET | Freq: Every morning | ORAL | Status: DC

## 2014-07-04 NOTE — Telephone Encounter (Signed)
Rx was sent to pharmacy electronically. 

## 2014-07-06 ENCOUNTER — Telehealth: Payer: Self-pay | Admitting: Cardiovascular Disease

## 2014-07-06 ENCOUNTER — Encounter: Payer: Self-pay | Admitting: Cardiology

## 2014-07-06 MED ORDER — EZETIMIBE 10 MG PO TABS
10.0000 mg | ORAL_TABLET | Freq: Every day | ORAL | Status: DC
Start: 2014-07-06 — End: 2014-08-08

## 2014-07-06 NOTE — Telephone Encounter (Signed)
Need some samples of Zetia 10 mg please.

## 2014-07-06 NOTE — Telephone Encounter (Signed)
Spoke with patient Wife Liborio NixonJanice and let her know that the samples would be up front for pick up.

## 2014-07-12 ENCOUNTER — Encounter: Payer: Self-pay | Admitting: Cardiovascular Disease

## 2014-08-08 ENCOUNTER — Other Ambulatory Visit: Payer: Self-pay | Admitting: *Deleted

## 2014-08-08 ENCOUNTER — Telehealth: Payer: Self-pay | Admitting: Cardiovascular Disease

## 2014-08-08 MED ORDER — EZETIMIBE 10 MG PO TABS: 10.0000 mg | ORAL_TABLET | Freq: Every day | ORAL | Status: DC

## 2014-08-08 NOTE — Telephone Encounter (Signed)
Would like some samples of Zetta 10 mg please. Wife coming in for an an appt.today would like to pick them up when she comes in.

## 2014-08-08 NOTE — Telephone Encounter (Signed)
Samples were given to wife during appointment with Dr.Croitoru today.

## 2014-08-16 ENCOUNTER — Other Ambulatory Visit (HOSPITAL_COMMUNITY): Payer: Self-pay | Admitting: Cardiovascular Disease

## 2014-08-16 DIAGNOSIS — R0989 Other specified symptoms and signs involving the circulatory and respiratory systems: Secondary | ICD-10-CM

## 2014-09-12 ENCOUNTER — Telehealth: Payer: Self-pay

## 2014-09-12 ENCOUNTER — Other Ambulatory Visit: Payer: Self-pay | Admitting: *Deleted

## 2014-09-12 ENCOUNTER — Ambulatory Visit (HOSPITAL_COMMUNITY)
Admission: RE | Admit: 2014-09-12 | Discharge: 2014-09-12 | Disposition: A | Discharge status: Home / Self Care | Payer: Medicare Other | Source: Ambulatory Visit | Attending: Cardiology | Admitting: Cardiology

## 2014-09-12 DIAGNOSIS — R0989 Other specified symptoms and signs involving the circulatory and respiratory systems: Secondary | ICD-10-CM

## 2014-09-12 DIAGNOSIS — I6529 Occlusion and stenosis of unspecified carotid artery: Secondary | ICD-10-CM | POA: Insufficient documentation

## 2014-09-12 MED ORDER — ISOSORBIDE MONONITRATE ER 30 MG PO TB24: 15.0000 mg | ORAL_TABLET | Freq: Every day | ORAL | Status: DC

## 2014-09-12 MED ORDER — HYDROCHLOROTHIAZIDE 25 MG PO TABS: 25.0000 mg | ORAL_TABLET | Freq: Every day | ORAL | Status: DC

## 2014-09-12 NOTE — Progress Notes (Signed)
Carotid Duplex Completed. Elizaveta Mattice, BS, RDMS, RVT  

## 2014-09-12 NOTE — Telephone Encounter (Signed)
Patient came in office for a echo.Patient requested zetia samples.Patient was given zetia 10 mg samples.

## 2014-09-14 ENCOUNTER — Telehealth: Payer: Self-pay | Admitting: Cardiology

## 2014-09-14 ENCOUNTER — Telehealth: Payer: Self-pay | Admitting: *Deleted

## 2014-09-14 ENCOUNTER — Ambulatory Visit (INDEPENDENT_AMBULATORY_CARE_PROVIDER_SITE_OTHER): Payer: Medicare Other | Admitting: *Deleted

## 2014-09-14 DIAGNOSIS — I6523 Occlusion and stenosis of bilateral carotid arteries: Secondary | ICD-10-CM

## 2014-09-14 DIAGNOSIS — I480 Paroxysmal atrial fibrillation: Secondary | ICD-10-CM

## 2014-09-14 DIAGNOSIS — I4891 Unspecified atrial fibrillation: Secondary | ICD-10-CM

## 2014-09-14 NOTE — Telephone Encounter (Signed)
Message copied by Vita Barley on Wed Sep 14, 2014  4:15 PM ------      Message from: Thurmon Fair      Created: Wed Sep 14, 2014  8:22 AM       Moderate right carotid stenosis is unchanged. Repeat in one year please ------

## 2014-09-14 NOTE — Telephone Encounter (Signed)
Confirmed remote transmission with pt wife.  

## 2014-09-14 NOTE — Telephone Encounter (Signed)
LM with carotid doppler results.  Order placed for yearly recheck. 

## 2014-09-15 LAB — MDC_IDC_ENUM_SESS_TYPE_REMOTE
Battery Impedance: 156 Ohm
Brady Statistic AP VP Percent: 1 %
Brady Statistic AP VS Percent: 99 %
Brady Statistic AS VP Percent: 0 %
Brady Statistic AS VS Percent: 0 %
Date Time Interrogation Session: 20150924132409
Lead Channel Impedance Value: 374 Ohm
Lead Channel Impedance Value: 684 Ohm
Lead Channel Pacing Threshold Amplitude: 1.125 V
Lead Channel Pacing Threshold Pulse Width: 0.4 ms
Lead Channel Sensing Intrinsic Amplitude: 8 mV
Lead Channel Setting Pacing Amplitude: 2 V
MDC IDC MSMT BATTERY REMAINING LONGEVITY: 126 mo
MDC IDC MSMT BATTERY VOLTAGE: 2.79 V
MDC IDC MSMT LEADCHNL RV PACING THRESHOLD AMPLITUDE: 1 V
MDC IDC MSMT LEADCHNL RV PACING THRESHOLD PULSEWIDTH: 0.4 ms
MDC IDC SET LEADCHNL RA PACING AMPLITUDE: 2.25 V
MDC IDC SET LEADCHNL RV PACING PULSEWIDTH: 0.4 ms
MDC IDC SET LEADCHNL RV SENSING SENSITIVITY: 2.8 mV

## 2014-09-15 NOTE — Progress Notes (Signed)
Remote pacemaker transmission.   

## 2014-09-22 ENCOUNTER — Encounter: Payer: Self-pay | Admitting: Cardiology

## 2014-09-22 ENCOUNTER — Other Ambulatory Visit: Payer: Self-pay | Admitting: *Deleted

## 2014-09-22 DIAGNOSIS — I6523 Occlusion and stenosis of bilateral carotid arteries: Secondary | ICD-10-CM

## 2014-09-23 ENCOUNTER — Encounter: Payer: Self-pay | Admitting: Cardiovascular Disease

## 2014-10-24 ENCOUNTER — Telehealth: Payer: Self-pay | Admitting: Cardiovascular Disease

## 2014-10-24 MED ORDER — EZETIMIBE 10 MG PO TABS: 10.0000 mg | ORAL_TABLET | Freq: Every day | ORAL | Status: DC

## 2014-10-24 NOTE — Telephone Encounter (Signed)
Left VM that samples are available for pick up

## 2014-10-24 NOTE — Telephone Encounter (Signed)
Pt's wife called in wanting to know if we have any samples of Zetia 10mg . Please call  Thanks

## 2014-11-21 ENCOUNTER — Telehealth: Payer: Self-pay | Admitting: Cardiovascular Disease

## 2014-11-21 MED ORDER — EZETIMIBE 10 MG PO TABS: 10.0000 mg | ORAL_TABLET | Freq: Every day | ORAL | Status: DC

## 2014-11-21 NOTE — Telephone Encounter (Signed)
Notified that samples are available for pick up

## 2014-11-21 NOTE — Telephone Encounter (Signed)
Pt would like some samples of Zetia please. °

## 2014-12-01 ENCOUNTER — Encounter (HOSPITAL_COMMUNITY): Payer: Self-pay | Admitting: Cardiovascular Disease

## 2014-12-19 ENCOUNTER — Telehealth: Payer: Self-pay | Admitting: Cardiovascular Disease

## 2014-12-19 MED ORDER — EZETIMIBE 10 MG PO TABS: 10.0000 mg | ORAL_TABLET | Freq: Every day | ORAL | Status: DC

## 2014-12-19 NOTE — Telephone Encounter (Signed)
Notified wife that zetia samples will be ready for pick up at their convenience.

## 2014-12-19 NOTE — Telephone Encounter (Signed)
Pt's wife called in wanting to know if there are any samples of Zetia 10 mg available for the pt. Please call  Thanks

## 2014-12-20 ENCOUNTER — Ambulatory Visit (INDEPENDENT_AMBULATORY_CARE_PROVIDER_SITE_OTHER): Payer: Medicare Other | Admitting: Cardiovascular Disease

## 2014-12-20 ENCOUNTER — Encounter: Payer: Self-pay | Admitting: Cardiovascular Disease

## 2014-12-20 VITALS — BP 140/80 | HR 76 | Ht 63.0 in | Wt 144.5 lb

## 2014-12-20 DIAGNOSIS — I2510 Atherosclerotic heart disease of native coronary artery without angina pectoris: Secondary | ICD-10-CM

## 2014-12-20 DIAGNOSIS — I480 Paroxysmal atrial fibrillation: Secondary | ICD-10-CM

## 2014-12-20 DIAGNOSIS — Z954 Presence of other heart-valve replacement: Secondary | ICD-10-CM

## 2014-12-20 DIAGNOSIS — Z952 Presence of prosthetic heart valve: Secondary | ICD-10-CM

## 2014-12-20 DIAGNOSIS — Z950 Presence of cardiac pacemaker: Secondary | ICD-10-CM

## 2014-12-20 DIAGNOSIS — I6523 Occlusion and stenosis of bilateral carotid arteries: Secondary | ICD-10-CM

## 2014-12-20 NOTE — Progress Notes (Signed)
Patient ID: [name], male   DOB: 18-Apr-1924, 80 y.o.   MRN: [id number]     Reason for office visit Valvular heart disease (s/p bioprosthetic AVR), coronary artery disease status post CABG, pacemaker  [name] has had an uneventful year from a cardiac point of view. He denies angina or dyspnea. Pacemaker interrogation has not shown meaningful burden of atrial fibrillation. Longest episode of arrhythmia was atrial tachycardia 220 bpm, under 3 minutes duration in June. He had atrial fibrillation last more than a year ago.  He has a long-standing history of multiple cardiac problems. In 2004 he underwent multivessel bypass surgery (LIMA to LAD, SVG to diagonal, SVG to circumflex, SVG to RCA) and received a 21 mm pericardal aortic valve prosthesis, Dr. Prescott Gum. It is important to note that he is a Sales promotion account executive Witness and insists on no blood product transfusions. He had postoperative atrial fibrillation.  In 2007 he had right coronary artery graft disease that required stenting with 3 consecutive Cypher drug-eluting stents all 3.5 in diameter, cumulative length of 79 mm.  The last echo evaluation of his prosthetic valve was in September of 2014. The peak and mean gradients were excellent at 13 and 8 mm Hg respectively. There was no significant aortic insufficiency. Left ventricular systolic function was borderline low at 50-55% with inferior and inferolateral hypokinesis.  In 2004, he received a dual-chamber permanent pacemaker for sinus bradycardia. He received a new generator [name]) in March of 2014.   He has treated HTN and hyperlipidemia on Zetia (statin intolerant due to myalgia).  His most recent remote check was in September. Repeat check today shows normal device function. He has had atrial fibrillation rapid ventricular response lasting for several hours in July of 2014. There is virtually 100% atrial pacing and virtually no ventricular pacing. Today's rhythm is atrial  paced ventricular sensed.   Allergies  Allergen Reactions  . Amiodarone Diarrhea and Nausea And Vomiting  . Celebrex [Celecoxib]   . Hytrin [Terazosin]   . Indocin [Indomethacin]   . Keflex [Cephalexin] Nausea And Vomiting  . Norvasc [Amlodipine Besylate]   . Penicillins Swelling  . Procardia [Nifedipine]   . Statins Other (See Comments)    "leg pain"  . Sular [Nisoldipine Er] Other (See Comments)    "stopped heartbeat"   . Tekturna [Aliskiren]   . Vasotec [Enalapril]     Current Outpatient Prescriptions  Medication Sig Dispense Refill  . clopidogrel (PLAVIX) 75 MG tablet Take 75 mg by mouth every morning.    . diltiazem (CARDIZEM CD) 180 MG 24 hr capsule Take 1 capsule (180 mg total) by mouth daily. 90 capsule 1  . ezetimibe (ZETIA) 10 MG tablet Take 1 tablet (10 mg total) by mouth daily. 42 tablet 0  . fexofenadine (ALLEGRA) 180 MG tablet Take 180 mg by mouth as needed for allergies or rhinitis.    . fluorouracil (EFUDEX) 5 % cream Apply 1 application topically 2 (two) times daily. Applies to nose.    . hydrochlorothiazide (HYDRODIURIL) 25 MG tablet Take 1 tablet (25 mg total) by mouth daily. 90 tablet 0  . isosorbide mononitrate (IMDUR) 30 MG 24 hr tablet Take 0.5 tablets (15 mg total) by mouth daily. 45 tablet 0  . metFORMIN (GLUCOPHAGE) 500 MG tablet Take 500 mg by mouth daily with breakfast.    . metoprolol (LOPRESSOR) 50 MG tablet Take 50 mg by mouth 2 (two) times daily.    . nitroGLYCERIN (NITROSTAT) 0.4 MG SL tablet Place 0.4  mg under the tongue every 5 (five) minutes as needed for chest pain.    Marland Kitchen omeprazole (PRILOSEC) 20 MG capsule Take 20 mg by mouth every morning.    Marland Kitchen spironolactone (ALDACTONE) 25 MG tablet Take 1 tablet (25 mg total) by mouth every morning. 90 tablet 1  . Vitamin D, Ergocalciferol, (DRISDOL) 50000 UNITS CAPS Take 50,000 Units by mouth 2 (two) times a week. Takes 1 capsule on Mondays mornings and 1 capsule on Friday mornings.     No current  facility-administered medications for this visit.    Past Medical History  Diagnosis Date  . CAD (coronary artery disease) 2004    CABG X 4  . Aortic stenosis 2004    tissue AVR  . Refusal of blood transfusions as patient is Jehovah's Witness   . Pacemaker 2007, March 2013    MDT  . HTN (hypertension)   . Anemia, chronic disease   . History of nephrolithiasis   . RBBB     Past Surgical History  Procedure Laterality Date  . Coronary artery bypass graft  2004    LIMA-LAD; VT-diagonal; VG-circumflex; VG-RCA  . Aortic valve replacement  2004  . Coronary angioplasty with stent placement  2007    Occluded DG-RCA ,Native RCA, overlapping DES stents  . Pacemaker insertion  2007  . Permanent pacemaker generator change  March 2013    MDT  . Cholecystectomy  1993  . Transthoracic echocardiogram  09/08/13     EF 50-55% moderate hypokinesis of the inferior lateral myocardium, grade 1 diastolic dysfunction, aortic valve bioprosthetic with mild regurg valve area 1.7 mild mitral regurg   . Cardiovascular stress test  04/30/2012    EF 49%, inferior lateral hypokinesis-akinesis no reversible ischemia, large fixed inferior lateral scar  . Dental restoration/extraction with x-ray  1965  . Permanent pacemaker generator change N/A 02/25/2013    Procedure: PERMANENT PACEMAKER GENERATOR CHANGE;  Surgeon: Sanda Klein, MD;  Location: [facility];  Service: Cardiovascular;  Laterality: N/A;    Family History  Problem Relation Age of Onset  . Cancer Mother   . Heart disease Mother   . Heart disease Father   . Pneumonia Sister   . Heart disease Brother     History   Social History  . Marital Status: Married    Spouse Name: N/A    Number of Children: N/A  . Years of Education: N/A   Occupational History  . retired     Pensions consultant   Social History Main Topics  . Smoking status: Former Smoker    Quit date: 10/21/1950  . Smokeless tobacco: Not on file  . Alcohol Use: Not on file  .  Drug Use: Not on file  . Sexual Activity: Not on file   Other Topics Concern  . Not on file   Social History Narrative    Review of systems: The patient specifically denies any chest pain at rest or with exertion, dyspnea at rest or with exertion, orthopnea, paroxysmal nocturnal dyspnea, syncope, palpitations, focal neurological deficits, intermittent claudication, unexplained weight gain, cough, hemoptysis or wheezing.  He has lower extremity edema, right greater than left, towards the end of the day, resolving by morning.  The patient also denies abdominal pain, nausea, vomiting, dysphagia, diarrhea, constipation, polyuria, polydipsia, dysuria, hematuria, frequency, urgency, abnormal bleeding or bruising, fever, chills, unexpected weight changes, mood swings, change in skin or hair texture, change in voice quality, auditory or visual problems, allergic reactions or rashes, new musculoskeletal  complaints other than usual "aches and pains".   PHYSICAL EXAM BP 140/80 mmHg  Pulse 76  Ht 5' 3"  (1.6 m)  Wt 144 lb 8 oz (65.545 kg)  BMI 25.60 kg/m2 General: Alert, oriented x3, no distress Head: no evidence of trauma, PERRL, EOMI, no exophtalmos or lid lag, no myxedema, no xanthelasma; normal ears, nose and oropharynx Neck: normal jugular venous pulsations and no hepatojugular reflux; brisk carotid pulses without delay and no carotid bruits Chest: clear to auscultation, no signs of consolidation by percussion or palpation, normal fremitus, symmetrical and full respiratory excursions Cardiovascular: normal position and quality of the apical impulse, regular rhythm, normal first and second heart sounds, no murmurs, rubs or gallops Abdomen: no tenderness or distention, no masses by palpation, no abnormal pulsatility or arterial bruits, normal bowel sounds, no hepatosplenomegaly Extremities: no clubbing, cyanosis or edema; 2+ radial, ulnar and brachial pulses bilaterally; 2+ right femoral,  posterior tibial and dorsalis pedis pulses; 2+ left femoral, posterior tibial and dorsalis pedis pulses; no subclavian or femoral bruits Neurological: grossly nonfocal   YIA:XKPVVZ paced, ventricular sensed, right bundle branch block, old inferior infarction,  T wave inversion in leads V4 through V6 is unchanged from previous tracings   BMET    Component Value Date/Time   NA 140 04/09/2008 0458   K 4.1 04/09/2008 0458   CL 103 04/09/2008 0458   CO2 29 04/09/2008 0458   GLUCOSE 107* 04/09/2008 0458   BUN 21 04/09/2008 0458   CREATININE 1.08 04/09/2008 0458   CALCIUM 8.7 04/09/2008 0458   GFRNONAA >60 04/09/2008 0458   GFRAA  04/09/2008 0458    >60        The eGFR has been calculated using the MDRD equation. This calculation has not been validated in all clinical     ASSESSMENT AND PLAN Pacemaker- MDT '07, gen change March 2014 Normal device function , compliant with Q3 month remote monitoring via [employer].  CAD- CABG X 4 '04. PCI/DES '07, low risk Myoview 5/13 No angina pectoris. NYHA functional class II dyspnea on exertion is a chronic abnormality. He has evidence of a nontransmural infarction in the right coronary artery territory. EF is borderline at 50-55%  Aortic valve prosthesis present- Tissue AVR '04 Normal prosthetic valve. Reevaluate in 2016 with echo.  PAF (paroxysmal atrial fibrillation) No meaningful recorded event since July 2014. 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 He has never had an episode of TIA or a stroke or other embolic events. We discussed the fact that aspirin and clopidogrel, while beneficial for stroke prevention, are not as effective as warfarin or a novel anticoagulant. If AF prevalence increase, may have to switch to a full anticoagulation strategy.  Orders Placed This Encounter  Procedures  . EKG 12-Lead   Meds ordered this encounter  Medications  . fexofenadine (ALLEGRA) 180 MG tablet    Sig: Take 180 mg by mouth as needed  for allergies or rhinitis.    Holli Humbles, MD, [place] [phone] office [phone] pager

## 2014-12-20 NOTE — Patient Instructions (Signed)
Remote monitoring is used to monitor your pacemaker from home. This monitoring reduces the number of office visits required to check your device to one time per year. It allows us to keep an eye on the functioning of your device to ensure it is working properly. You are scheduled for a device check from home on 03-21-2015. You may send your transmission at any time that day. If you have a wireless device, the transmission will be sent automatically. After your physician reviews your transmission, you will receive a postcard with your next transmission date.  Your physician recommends that you schedule a follow-up appointment in: 12 months with Dr.Croitoru

## 2014-12-27 LAB — MDC_IDC_ENUM_SESS_TYPE_INCLINIC
Battery Impedance: 180 Ohm
Battery Remaining Longevity: 124 mo
Battery Voltage: 2.79 V
Brady Statistic AP VP Percent: 2 %
Brady Statistic AS VS Percent: 0 %
Lead Channel Impedance Value: 368 Ohm
Lead Channel Impedance Value: 794 Ohm
Lead Channel Pacing Threshold Amplitude: 1 V
Lead Channel Pacing Threshold Pulse Width: 0.4 ms
Lead Channel Sensing Intrinsic Amplitude: 1 mV
Lead Channel Setting Pacing Amplitude: 2 V
Lead Channel Setting Pacing Amplitude: 2.5 V
Lead Channel Setting Pacing Pulse Width: 0.4 ms
Lead Channel Setting Sensing Sensitivity: 4 mV
MDC IDC MSMT LEADCHNL RA PACING THRESHOLD AMPLITUDE: 1 V
MDC IDC MSMT LEADCHNL RV PACING THRESHOLD PULSEWIDTH: 0.4 ms
MDC IDC MSMT LEADCHNL RV SENSING INTR AMPL: 8 mV
MDC IDC SESS DTM: 20151229212438
MDC IDC STAT BRADY AP VS PERCENT: 98 %
MDC IDC STAT BRADY AS VP PERCENT: 0 %

## 2015-01-04 ENCOUNTER — Encounter: Payer: Self-pay | Admitting: Cardiovascular Disease

## 2015-02-01 ENCOUNTER — Telehealth: Payer: Self-pay | Admitting: Cardiovascular Disease

## 2015-02-01 MED ORDER — EZETIMIBE 10 MG PO TABS: 10.0000 mg | ORAL_TABLET | Freq: Every day | ORAL | Status: DC

## 2015-02-01 NOTE — Telephone Encounter (Signed)
Pt need some samples of Zetia 10 mg please. °

## 2015-02-01 NOTE — Telephone Encounter (Signed)
Samples of this drug were put at front desk for the patient, quantity 28(3 packs), Lot Number [id number] 3/18  Patient notified

## 2015-02-27 ENCOUNTER — Other Ambulatory Visit: Payer: Self-pay | Admitting: Cardiovascular Disease

## 2015-02-27 NOTE — Telephone Encounter (Signed)
Pt would like some samples of Zetia please. °

## 2015-02-27 NOTE — Telephone Encounter (Signed)
Medication samples have been provided to the patient.  Drug name: Zetia 10mg   Qty: 42  LOT: [id number]  Exp.Date: March 2018  Samples left at front desk for patient pick-up. Patient/wife notified and advised to obtain DPR form  Lindell Sparlkins, Jenna M 10:09 AM 02/27/2015

## 2015-03-21 ENCOUNTER — Ambulatory Visit (INDEPENDENT_AMBULATORY_CARE_PROVIDER_SITE_OTHER): Payer: Medicare Other | Admitting: *Deleted

## 2015-03-21 ENCOUNTER — Telehealth: Payer: Self-pay | Admitting: Cardiology

## 2015-03-21 ENCOUNTER — Telehealth: Payer: Self-pay | Admitting: Cardiovascular Disease

## 2015-03-21 DIAGNOSIS — I480 Paroxysmal atrial fibrillation: Secondary | ICD-10-CM

## 2015-03-21 NOTE — Telephone Encounter (Signed)
Spoke with pt and reminded pt of remote transmission that is due today. Pt verbalized understanding.   

## 2015-03-21 NOTE — Telephone Encounter (Signed)
New Message  Pt wife calling to confirm if remote transmission was successful. Pt wife wanted to speak w/ Device clinic today. Please call back and dsicuss.

## 2015-03-22 ENCOUNTER — Encounter: Payer: Self-pay | Admitting: Cardiovascular Disease

## 2015-03-22 ENCOUNTER — Telehealth: Payer: Self-pay | Admitting: Cardiovascular Disease

## 2015-03-22 DIAGNOSIS — I480 Paroxysmal atrial fibrillation: Secondary | ICD-10-CM | POA: Diagnosis not present

## 2015-03-22 LAB — MDC_IDC_ENUM_SESS_TYPE_REMOTE
Battery Impedance: 180 Ohm
Battery Remaining Longevity: 118 mo
Battery Voltage: 2.78 V
Brady Statistic AP VP Percent: 5 %
Brady Statistic AP VS Percent: 94 %
Brady Statistic AS VP Percent: 0 %
Date Time Interrogation Session: 20160330120321
Lead Channel Impedance Value: 398 Ohm
Lead Channel Pacing Threshold Amplitude: 0.74 V
Lead Channel Pacing Threshold Amplitude: 1 V
Lead Channel Pacing Threshold Pulse Width: 0.4 ms
Lead Channel Setting Pacing Amplitude: 2 V
Lead Channel Setting Pacing Amplitude: 2.5 V
Lead Channel Setting Pacing Pulse Width: 0.4 ms
Lead Channel Setting Sensing Sensitivity: 4 mV
MDC IDC MSMT LEADCHNL RV IMPEDANCE VALUE: 695 Ohm
MDC IDC MSMT LEADCHNL RV PACING THRESHOLD PULSEWIDTH: 0.4 ms
MDC IDC MSMT LEADCHNL RV SENSING INTR AMPL: 8 mV
MDC IDC STAT BRADY AS VS PERCENT: 1 %

## 2015-03-22 NOTE — Telephone Encounter (Signed)
Spoke with patient's wife- received transmission. Notified of next transmission date. Wife reports potential travel plans around next remote schedule- she will call to notify if travel interferes.

## 2015-03-22 NOTE — Telephone Encounter (Signed)
Pt have been having trouble transmitting his pacemaker for 2 days.

## 2015-03-22 NOTE — Progress Notes (Signed)
Remote pacemaker transmission.   

## 2015-03-24 ENCOUNTER — Encounter: Payer: Self-pay | Admitting: Cardiology

## 2015-03-24 NOTE — Telephone Encounter (Signed)
Remote from 3/30 received. Wife voiced understanding.

## 2015-04-03 ENCOUNTER — Telehealth: Payer: Self-pay | Admitting: Cardiovascular Disease

## 2015-04-03 MED ORDER — EZETIMIBE 10 MG PO TABS: 10.0000 mg | ORAL_TABLET | Freq: Every day | ORAL | Status: DC

## 2015-04-03 NOTE — Telephone Encounter (Signed)
Patient's wife, Larry NixonJanice, stated that they were going out of town. She was afraid that he would run out of samples before they are able to return. Told wife that I would print a prescription for Zetia for him to take with them if needed. Wife understood and agreed with plan.

## 2015-04-03 NOTE — Telephone Encounter (Signed)
Pt would like some samples of Zetia please. °

## 2015-04-03 NOTE — Telephone Encounter (Signed)
Medication samples have been provided to the patient.  Drug name: Zetia 10 mg Qty: 28 tabs LOT: [id number] Exp.Date: 10/2017  Samples left at front desk for patient pick-up. Patient notified.  Truitt, Chelley 4:42 PM 04/03/2015

## 2015-05-08 ENCOUNTER — Telehealth: Payer: Self-pay | Admitting: Cardiovascular Disease

## 2015-05-08 NOTE — Telephone Encounter (Signed)
Advised caller no zetia samples currently in stock - she verbalized understanding.

## 2015-05-08 NOTE — Telephone Encounter (Signed)
Patient wants samples for Zetia 10 mg.

## 2015-06-01 ENCOUNTER — Telehealth: Payer: Self-pay | Admitting: Cardiovascular Disease

## 2015-06-01 NOTE — Telephone Encounter (Signed)
Please call,question about his Zetia.

## 2015-06-01 NOTE — Telephone Encounter (Signed)
Patient is veteran. He can get some medications for $8/month from [place]  Zetia is not on their formulary - but if Dr. Salena Saner gives patient Rx and explains that patient can only take zetia (cannot take statins) and office note, etc explaining that patient needs to be on zetia.  Patient's wife states a letter, etc can be mailed to them and they will take this to the [place]  Patient cannot afford Rx from regular pharmacy.   Will route message to Dr. Salena Saner & Britta Mccreedy

## 2015-06-01 NOTE — Telephone Encounter (Signed)
Will try, but please explain I will not be there until next week. Britta Mccreedy, could you get a letter ready, please?

## 2015-06-02 MED ORDER — EZETIMIBE 10 MG PO TABS: 10.0000 mg | ORAL_TABLET | Freq: Every day | ORAL | Status: AC

## 2015-06-02 NOTE — Telephone Encounter (Signed)
Patient is aware Dr C is out of office until next week. Also explained that we have a slow outgoing mail system and patient's wife voiced understanding of this.

## 2015-06-02 NOTE — Telephone Encounter (Signed)
Letter drafted for Dr. Renaye Rakers signature to try to get the [facility] to cover his Zetia along with a printed Rx for Zetia.  Will mail to patient once they are signed (should be no later than Tuesday).  LM with this info for patient.

## 2015-06-15 ENCOUNTER — Encounter: Payer: Self-pay | Admitting: Cardiovascular Disease

## 2015-06-19 ENCOUNTER — Telehealth: Payer: Self-pay | Admitting: *Deleted

## 2015-06-19 NOTE — Telephone Encounter (Signed)
Cardiac clearance for knee procedure and authorization to hold Plavix 5 days prior to the procedure faxed to Dr. Rubye Oaks.

## 2015-06-21 ENCOUNTER — Ambulatory Visit (INDEPENDENT_AMBULATORY_CARE_PROVIDER_SITE_OTHER): Payer: Medicare Other | Admitting: *Deleted

## 2015-06-21 ENCOUNTER — Encounter: Payer: Self-pay | Admitting: Cardiovascular Disease

## 2015-06-21 DIAGNOSIS — I480 Paroxysmal atrial fibrillation: Secondary | ICD-10-CM

## 2015-06-21 NOTE — Progress Notes (Signed)
Remote pacemaker transmission.   

## 2015-06-26 LAB — CUP PACEART REMOTE DEVICE CHECK
Battery Impedance: 204 Ohm
Brady Statistic AP VP Percent: 4 %
Brady Statistic AP VS Percent: 96 %
Brady Statistic AS VP Percent: 0 %
Date Time Interrogation Session: 20160629125217
Lead Channel Impedance Value: 383 Ohm
Lead Channel Impedance Value: 661 Ohm
Lead Channel Pacing Threshold Amplitude: 1 V
Lead Channel Pacing Threshold Pulse Width: 0.4 ms
Lead Channel Sensing Intrinsic Amplitude: 8 mV
Lead Channel Setting Pacing Amplitude: 2 V
Lead Channel Setting Sensing Sensitivity: 4 mV
MDC IDC MSMT BATTERY REMAINING LONGEVITY: 113 mo
MDC IDC MSMT BATTERY VOLTAGE: 2.79 V
MDC IDC MSMT LEADCHNL RV PACING THRESHOLD AMPLITUDE: 0.875 V
MDC IDC MSMT LEADCHNL RV PACING THRESHOLD PULSEWIDTH: 0.4 ms
MDC IDC SET LEADCHNL RV PACING AMPLITUDE: 2.5 V
MDC IDC SET LEADCHNL RV PACING PULSEWIDTH: 0.4 ms
MDC IDC STAT BRADY AS VS PERCENT: 1 %

## 2015-07-10 ENCOUNTER — Encounter: Payer: Self-pay | Admitting: Cardiology

## 2015-09-25 ENCOUNTER — Ambulatory Visit (INDEPENDENT_AMBULATORY_CARE_PROVIDER_SITE_OTHER): Payer: Medicare Other | Admitting: *Deleted

## 2015-09-25 ENCOUNTER — Encounter: Payer: Self-pay | Admitting: Cardiovascular Disease

## 2015-09-25 ENCOUNTER — Telehealth: Payer: Self-pay | Admitting: Cardiovascular Disease

## 2015-09-25 DIAGNOSIS — I480 Paroxysmal atrial fibrillation: Secondary | ICD-10-CM

## 2015-09-25 NOTE — Telephone Encounter (Signed)
Does pt need an appt before he can be cleared for Hematuria  CT Study?

## 2015-09-25 NOTE — Telephone Encounter (Signed)
Kim calling from urology office. Requesting patient be seen - last OV here Dec 2015 w/ Dr. Royann Shivers. He will need a cardiac clearance for a cystoscopy and hematuria CT study.  Message sent to scheduler to set up for [facility] visit.  Routed to Dr. Royann Shivers for FYI/any further recommendations.

## 2015-09-25 NOTE — Progress Notes (Signed)
Remote pacemaker transmission.   

## 2015-09-26 LAB — CUP PACEART REMOTE DEVICE CHECK
Battery Impedance: 227 Ohm
Battery Remaining Longevity: 111 mo
Battery Voltage: 2.79 V
Brady Statistic AP VS Percent: 97 %
Brady Statistic AS VP Percent: 0 %
Date Time Interrogation Session: 20161003113925
Lead Channel Impedance Value: 373 Ohm
Lead Channel Impedance Value: 659 Ohm
Lead Channel Pacing Threshold Amplitude: 0.75 V
Lead Channel Pacing Threshold Amplitude: 0.875 V
Lead Channel Pacing Threshold Pulse Width: 0.4 ms
Lead Channel Pacing Threshold Pulse Width: 0.4 ms
Lead Channel Setting Pacing Amplitude: 2.5 V
Lead Channel Setting Sensing Sensitivity: 4 mV
MDC IDC MSMT LEADCHNL RV SENSING INTR AMPL: 8 mV
MDC IDC SET LEADCHNL RA PACING AMPLITUDE: 1.75 V
MDC IDC SET LEADCHNL RV PACING PULSEWIDTH: 0.4 ms
MDC IDC STAT BRADY AP VP PERCENT: 3 %
MDC IDC STAT BRADY AS VS PERCENT: 1 %

## 2015-09-26 NOTE — Telephone Encounter (Signed)
He does not need another visit for clearance. Please find out who his urologist is so that I can send a letter

## 2015-09-27 NOTE — Telephone Encounter (Signed)
Called back to [facility] - either Dr. Delford Field or Dr. Gershon Mussel will be performing the cystoscopy.  Kim provided fax # [phone]   Called patient, informed him we do not need to see him in advance of this study - cancelled appt - he voiced understanding.  Routing to Dr. Royann Shivers.

## 2015-09-28 ENCOUNTER — Encounter: Payer: Self-pay | Admitting: Cardiovascular Disease

## 2015-09-28 NOTE — Telephone Encounter (Signed)
Letter in EPIC, please fax

## 2015-09-28 NOTE — Telephone Encounter (Signed)
Letter printed, stamped, faxed to urology office. Called Kim, left message w/ operator to call if fax unreceived or if additional questions.

## 2015-10-04 ENCOUNTER — Inpatient Hospital Stay (HOSPITAL_COMMUNITY): Admission: RE | Admit: 2015-10-04 | Payer: Medicare Other | Source: Ambulatory Visit

## 2015-10-05 ENCOUNTER — Encounter: Payer: Self-pay | Admitting: Cardiology

## 2015-10-13 ENCOUNTER — Ambulatory Visit: Payer: Medicare Other | Admitting: Cardiology

## 2015-10-31 ENCOUNTER — Encounter: Payer: Self-pay | Admitting: Cardiovascular Disease

## 2015-11-08 ENCOUNTER — Ambulatory Visit (HOSPITAL_COMMUNITY)
Admission: RE | Admit: 2015-11-08 | Discharge: 2015-11-08 | Disposition: A | Discharge status: Home / Self Care | Payer: Medicare Other | Source: Ambulatory Visit | Attending: Cardiovascular Disease | Admitting: Cardiovascular Disease

## 2015-11-08 ENCOUNTER — Other Ambulatory Visit: Payer: Self-pay | Admitting: Cardiovascular Disease

## 2015-11-08 DIAGNOSIS — I6523 Occlusion and stenosis of bilateral carotid arteries: Secondary | ICD-10-CM | POA: Diagnosis present

## 2015-11-08 DIAGNOSIS — I4510 Unspecified right bundle-branch block: Secondary | ICD-10-CM | POA: Diagnosis not present

## 2015-11-08 DIAGNOSIS — I10 Essential (primary) hypertension: Secondary | ICD-10-CM | POA: Insufficient documentation

## 2015-11-09 ENCOUNTER — Telehealth: Payer: Self-pay | Admitting: *Deleted

## 2015-11-09 DIAGNOSIS — I6523 Occlusion and stenosis of bilateral carotid arteries: Secondary | ICD-10-CM

## 2015-11-09 NOTE — Telephone Encounter (Signed)
-----   Message from Thurmon FairMihai Croitoru, MD sent at 11/08/2015  6:15 PM EST ----- There has been mild interval worsening of the blockage in his right carotid. As long as he has no neurological symptoms, this is still best left for treatment with medication (cholesterol lowering, normal blood pressure, aspirin , etc.) repeat study in one year

## 2015-11-09 NOTE — Telephone Encounter (Signed)
Carotid doppler results given to wife.  Will recheck in one year.  Order placed.  [name] voiced understanding.  Patient will keep his appointment in January.

## 2015-12-08 ENCOUNTER — Encounter: Payer: Medicare Other | Admitting: Cardiovascular Disease

## 2015-12-27 ENCOUNTER — Encounter: Payer: Medicare Other | Admitting: Cardiovascular Disease

## 2016-01-26 ENCOUNTER — Encounter: Payer: Self-pay | Admitting: Cardiovascular Disease

## 2016-01-26 ENCOUNTER — Ambulatory Visit (INDEPENDENT_AMBULATORY_CARE_PROVIDER_SITE_OTHER): Payer: Medicare Other | Admitting: Cardiovascular Disease

## 2016-01-26 VITALS — BP 110/62 | HR 74 | Ht 63.0 in | Wt 139.4 lb

## 2016-01-26 DIAGNOSIS — I2510 Atherosclerotic heart disease of native coronary artery without angina pectoris: Secondary | ICD-10-CM | POA: Diagnosis not present

## 2016-01-26 DIAGNOSIS — I5189 Other ill-defined heart diseases: Secondary | ICD-10-CM

## 2016-01-26 DIAGNOSIS — I6521 Occlusion and stenosis of right carotid artery: Secondary | ICD-10-CM | POA: Insufficient documentation

## 2016-01-26 DIAGNOSIS — Z950 Presence of cardiac pacemaker: Secondary | ICD-10-CM | POA: Diagnosis not present

## 2016-01-26 DIAGNOSIS — Z952 Presence of prosthetic heart valve: Secondary | ICD-10-CM

## 2016-01-26 DIAGNOSIS — I480 Paroxysmal atrial fibrillation: Secondary | ICD-10-CM

## 2016-01-26 DIAGNOSIS — Z954 Presence of other heart-valve replacement: Secondary | ICD-10-CM | POA: Diagnosis not present

## 2016-01-26 DIAGNOSIS — I519 Heart disease, unspecified: Secondary | ICD-10-CM

## 2016-01-26 NOTE — Patient Instructions (Signed)
Remote monitoring is used to monitor your Pacemaker from home. This monitoring reduces the number of office visits required to check your device to one time per year. It allows [place] to monitor the functioning of your device to ensure it is working properly. You are scheduled for a device check from home on Apr 25, 2016. You may send your transmission at any time that day. If you have a wireless device, the transmission will be sent automatically. After your physician reviews your transmission, you will receive a postcard with your next transmission date.  Dr. Royann Shivers recommends that you schedule a follow-up appointment in: ONE YEAR

## 2016-01-26 NOTE — Progress Notes (Signed)
Patient ID: [name], male   DOB: 1924/06/24, 82 y.o.   MRN: [id number]    Cardiology Office Note    Date:  01/26/2016   ID:  [name], DOB [birth year], MRN [id number]  PCP:  Romeo Rabon, MD  Cardiologist:   Thurmon Fair, MD   Chief Complaint  Patient presents with  . Annual Exam    no chest pain, shortness of breath, edema, pain or cramping in legs, lightheadedness or dizziness    History of Present Illness:  [name] is a 82 y.o. male with a long-standing history of coronary, valvular and arrhythmic cardiac problems. He returns for yearly clinical and pacemaker follow-up.  He has generally had a good year without any new health problems. He specifically denies exertional angina and dyspnea. He had one dizzy spell when he was applying eyedrops and it sounds like true vertigo. He has not had syncope and denies palpitations. He denies lower extremity edema, claudication or focal neurological complaints. He does complain of bilateral hip pain but this only occurs in the late evenings. He does not complain of the discomfort if he walks in the mornings.  She presents today in atrial paced ventricular sensed rhythm which is what his rhythm is almost all the time. His current pacemaker generator implanted in 2014 has another 9.5 years of longevity. Both leads were placed in 2007 and have excellent parameters. He has 99.4% atrial pacing and only 2% ventricular pacing. About a year ago he had a single episode of atrial tachycardia that lasted for about 5 minutes. He has not had any sustained arrhythmia since then and has been no atrial fibrillation. He has had occasional bursts of nonsustained ventricular tachycardia, the longest consisting of roughly 10 beats. All the arrhythmic events were asymptomatic.  He is scheduled for an appointment and blood work at the [facility] next month.  He has a long-standing history of multiple cardiac problems. In 2004 he underwent multivessel  bypass surgery (LIMA to LAD, SVG to diagonal, SVG to circumflex, SVG to RCA) and received a 21 mm pericardal aortic valve prosthesis, by Dr. Donata Clay. 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 He had postoperative atrial fibrillation.  In 2007 he had right coronary artery graft disease that required stenting with 3 consecutive Cypher drug-eluting stents all 3.5 in diameter, cumulative length of 79 mm.  The last echo evaluation of his prosthetic valve was in September of 2014. The peak and mean gradients were excellent at 13 and 8 mm Hg respectively. There was no significant aortic insufficiency. Left ventricular systolic function was borderline low at 50-55% with inferior and inferolateral hypokinesis.  In 2004, he received a dual-chamber permanent pacemaker for sinus bradycardia. He received a new generator Radiation protection practitioner) in March of 2014.   He has treated HTN and hyperlipidemia on Zetia (statin intolerant due to myalgia).  Carotid ultrasonography performed November 2016 shows mild interval worsening of right carotid stenosis that is still in the moderate range.    Past Medical History  Diagnosis Date  . CAD (coronary artery disease) 2004    CABG X 4  . Aortic stenosis 2004    tissue AVR  . Refusal of blood transfusions as patient is Jehovah's Witness   . Pacemaker 2007, March 2013    MDT  . HTN (hypertension)   . Anemia, chronic disease   . History of nephrolithiasis   . RBBB  Past Surgical History  Procedure Laterality Date  . Coronary artery bypass graft  2004    LIMA-LAD; VT-diagonal; VG-circumflex; VG-RCA  . Aortic valve replacement  2004  . Coronary angioplasty with stent placement  2007    Occluded DG-RCA ,Native RCA, overlapping DES stents  . Pacemaker insertion  2007  . Permanent pacemaker generator change  March 2013    MDT  . Cholecystectomy  1993  . Transthoracic echocardiogram  09/08/13     EF  50-55% moderate hypokinesis of the inferior lateral myocardium, grade 1 diastolic dysfunction, aortic valve bioprosthetic with mild regurg valve area 1.7 mild mitral regurg   . Cardiovascular stress test  04/30/2012    EF 49%, inferior lateral hypokinesis-akinesis no reversible ischemia, large fixed inferior lateral scar  . Dental restoration/extraction with x-ray  1965  . Permanent pacemaker generator change N/A 02/25/2013    Procedure: PERMANENT PACEMAKER GENERATOR CHANGE;  Surgeon: Thurmon Fair, MD;  Location: [facility] CATH LAB;  Service: Cardiovascular;  Laterality: N/A;    Outpatient Prescriptions Prior to Visit  Medication Sig Dispense Refill  . clopidogrel (PLAVIX) 75 MG tablet Take 75 mg by mouth every morning.    . diltiazem (CARDIZEM CD) 180 MG 24 hr capsule Take 1 capsule (180 mg total) by mouth daily. 90 capsule 1  . ezetimibe (ZETIA) 10 MG tablet Take 1 tablet (10 mg total) by mouth daily. 90 tablet 3  . fexofenadine (ALLEGRA) 180 MG tablet Take 180 mg by mouth as needed for allergies or rhinitis.    . fluorouracil (EFUDEX) 5 % cream Apply 1 application topically 2 (two) times daily. Applies to nose.    . hydrochlorothiazide (HYDRODIURIL) 25 MG tablet Take 1 tablet (25 mg total) by mouth daily. 90 tablet 0  . isosorbide mononitrate (IMDUR) 30 MG 24 hr tablet Take 0.5 tablets (15 mg total) by mouth daily. 45 tablet 0  . metFORMIN (GLUCOPHAGE) 500 MG tablet Take 500 mg by mouth daily with breakfast.    . metoprolol (LOPRESSOR) 50 MG tablet Take 50 mg by mouth 2 (two) times daily.    . nitroGLYCERIN (NITROSTAT) 0.4 MG SL tablet Place 0.4 mg under the tongue every 5 (five) minutes as needed for chest pain.    Marland Kitchen omeprazole (PRILOSEC) 20 MG capsule Take 20 mg by mouth every morning.    Marland Kitchen spironolactone (ALDACTONE) 25 MG tablet Take 1 tablet (25 mg total) by mouth every morning. 90 tablet 1  . Vitamin D, Ergocalciferol, (DRISDOL) 50000 UNITS CAPS Take 50,000 Units by mouth 2 (two) times a week.  Takes 1 capsule on Mondays mornings and 1 capsule on Friday mornings.     No facility-administered medications prior to visit.     Allergies:   Amiodarone; Celebrex; Hytrin; Indocin; Keflex; Nisoldipine; Norvasc; Penicillins; Procardia; Statins; Nickie Retort; and Vasotec   Social History   Social History  . Marital Status: Married    Spouse Name: N/A  . Number of Children: N/A  . Years of Education: N/A   Occupational History  . retired     Personnel officer   Social History Main Topics  . Smoking status: Former Smoker    Quit date: 10/21/1950  . Smokeless tobacco: None  . Alcohol Use: None  . Drug Use: None  . Sexual Activity: Not Asked   Other Topics Concern  . None   Social History Narrative     Family History:  The patient's family history includes Cancer in his mother; Heart disease in his brother,  father, and mother; Pneumonia in his sister.   ROS:   Please see the history of present illness.    ROS All other systems reviewed and are negative.   PHYSICAL EXAM:   VS:  BP 110/62 mmHg  Pulse 74  Ht 5\' 3"  (1.6 m)  Wt 63.22 kg (139 lb 6 oz)  BMI 24.70 kg/m2   GEN: Well nourished, well developed, in no acute distress HEENT: normal Neck: no JVD, faint bilateral carotid bruits, no masses Cardiac: RRR; 2/6 early peaking aortic ejection murmur, no diastolic murmurs, rubs, or gallops,no edema  Respiratory:  clear to auscultation bilaterally, normal work of breathing GI: soft, nontender, nondistended, + BS MS: no deformity or atrophy Skin: warm and dry, no rash Neuro:  Alert and Oriented x 3, Strength and sensation are intact Psych: euthymic mood, full affect  Wt Readings from Last 3 Encounters:  01/26/16 63.22 kg (139 lb 6 oz)  12/20/14 65.545 kg (144 lb 8 oz)  11/29/13 63.685 kg (140 lb 6.4 oz)      Studies/Labs Reviewed:   EKG:  EKG is atrial paced, ventricular sensed rhythmdered today.  The ekg ordered today dematrial paced, ventricular sensed  rhythm  Recent Labs: No results found for requested labs within last 365 days.   Lipid Panel    Component Value Date/Time   CHOL  04/09/2008 0458    152        ATP III CLASSIFICATION:  <200     mg/dL   Desirable  161-096  mg/dL   Borderline High  >=045    mg/dL   High   TRIG 80 40/98/1191 0458   HDL 27* 04/09/2008 0458   CHOLHDL 5.6 04/09/2008 0458   VLDL 16 04/09/2008 0458   LDLCALC * 04/09/2008 0458    109        Total Cholesterol/HDL:CHD Risk Coronary Heart Disease Risk Table                     Men   Women  1/2 Average Risk   3.4   3.3     ASSESSMENT:    1. Coronary artery disease involving native coronary artery of native heart without angina pectoris   2. PAF (paroxysmal atrial fibrillation) (HCC)   3. Pacemaker- MDT '07, gen change March 2014   4. Aortic valve prosthesis present- Tissue AVR '04   5. Diastolic dysfunction, grade 1 with EF 50-55% Sept 2014   6. Carotid stenosis, right      PLAN:  In order of problems listed above:  1. CAD: Asymptomatic, free of angina 2. PAF: Episodes of atrial fibrillation are extremely infrequent. I don't think we have recorded one since July 2014. 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 Despite high embolic risk (CHADSVasc 4 - age 75, CAD, HTN) has never had a stroke or transient ischemic attack. He is very leery of full anticoagulation. We'll continue current antiplatelet regimen 3. PPM: Normal pacemaker function. No programming changes made. Continue remote downloads every 3 months and yearly follow-up. 4. S/p AVR: No clinical evidence of problems with the prosthesis. Normal function on echo in 2014. 5. Diastolic dysfunction without clinical heart failure 6. Right carotid stenosis: Heterogeneous plaque, bilaterally. Progression of RICA stenosis, now in 60-79% range. Stable 1-39% LICA stenosis. In the absence of clinical neurological events, continue with statin, clopidogrel    Medication Adjustments/Labs and Tests  Ordered: Current medicines are reviewed at length with the patient today.  Concerns regarding  medicines are outlined above.  Medication changes, Labs and Tests ordered today are listed in the Patient Instructions below. Patient Instructions  Remote monitoring is used to monitor your Pacemaker from home. This monitoring reduces the number of office visits required to check your device to one time per year. It allows [place] to monitor the functioning of your device to ensure it is working properly. You are scheduled for a device check from home on Apr 25, 2016. You may send your transmission at any time that day. If you have a wireless device, the transmission will be sent automatically. After your physician reviews your transmission, you will receive a postcard with your next transmission date.  Dr. Royann Shivers recommends that you schedule a follow-up appointment in: ONE YEAR         SignedThurmon Fair, MD  01/26/2016 5:57 PM    [facility] [address] [place], [place], [place]  [address] Phone: [phone]; Fax: [phone]

## 2016-02-14 ENCOUNTER — Encounter: Payer: Self-pay | Admitting: Cardiovascular Disease

## 2016-05-11 LAB — CUP PACEART INCLINIC DEVICE CHECK
Battery Impedance: 204 Ohm
Battery Remaining Longevity: 112 mo
Battery Voltage: 2.78 V
Brady Statistic AP VP Percent: 2 %
Brady Statistic AS VP Percent: 0 %
Brady Statistic AS VS Percent: 1 %
Implantable Lead Implant Date: 20070730
Implantable Lead Location: 753859
Implantable Lead Model: 4092
Lead Channel Setting Pacing Amplitude: 2 V
Lead Channel Setting Pacing Amplitude: 2.5 V
Lead Channel Setting Sensing Sensitivity: 4 mV
MDC IDC LEAD IMPLANT DT: 20070730
MDC IDC LEAD LOCATION: 753860
MDC IDC MSMT LEADCHNL RA IMPEDANCE VALUE: 368 Ohm
MDC IDC MSMT LEADCHNL RV IMPEDANCE VALUE: 695 Ohm
MDC IDC SESS DTM: 20170203190400
MDC IDC SET LEADCHNL RV PACING PULSEWIDTH: 0.4 ms
MDC IDC STAT BRADY AP VS PERCENT: 97 %

## 2016-05-15 ENCOUNTER — Encounter: Payer: Medicare Other | Admitting: *Deleted

## 2016-05-15 ENCOUNTER — Telehealth: Payer: Self-pay | Admitting: Cardiology

## 2016-05-15 NOTE — Telephone Encounter (Signed)
Confirmed remote transmission w/ pt wife. When pt is home from rehab she will have him send the remote transmission.

## 2016-05-17 ENCOUNTER — Encounter: Payer: Self-pay | Admitting: Cardiology

## 2016-05-29 ENCOUNTER — Telehealth: Payer: Self-pay | Admitting: Cardiovascular Disease

## 2016-05-29 NOTE — Telephone Encounter (Signed)
Returned Mrs. Nwosu's call.  Advised that the pin and rod implanted in patient's hip will not interfere with his PPM function.  Also advised that patient can send a remote transmission when he gets home from rehab in 3-4 weeks and we will process it and get him back on schedule.  Advised that if patient does not transmit on the day of his remote appointment, we call and give a reminder, and send a letter if the transmission is still not received.  Advised that patient and wife typically would not have received multiple calls regarding this remote transmission.  Patient's wife verbalizes understanding and is appreciative of assistance.  She is aware to call in the future with additional questions or concerns.

## 2016-05-29 NOTE — Telephone Encounter (Signed)
New message      1. Has your device fired? no  2. Is you device beeping? no  3. Are you experiencing draining or swelling at device site?no  4. Are you calling to see if we received your device transmission? no  5. Have you passed out? No    [facility] [place] the wife keeps getting calls to down load the husbands device but the pt will be at the rehab center for another 3-4 weeks and does not know how to use the device, but she would like for someone to know so the message will stop coming back to her .   The pt fell in April and to have hip surgery, and was admitted to the rehab center, the wife is grateful the place is keeping up with everything. The wife wanted to make sure the pt is not ignoring the message but there is nothing she can do.   The wife wanted to sure the pt can have a pace maker cause the pt had a rod and pin in after the surgery. Is it for the pt use the transmitting device cause the rod and pin in the body the wife states someone can leave a detailed message

## 2016-07-03 ENCOUNTER — Telehealth: Payer: Self-pay | Admitting: Cardiovascular Disease

## 2016-07-03 ENCOUNTER — Ambulatory Visit (INDEPENDENT_AMBULATORY_CARE_PROVIDER_SITE_OTHER): Payer: Medicare Other | Admitting: *Deleted

## 2016-07-03 DIAGNOSIS — I480 Paroxysmal atrial fibrillation: Secondary | ICD-10-CM | POA: Diagnosis not present

## 2016-07-03 DIAGNOSIS — Z950 Presence of cardiac pacemaker: Secondary | ICD-10-CM | POA: Diagnosis not present

## 2016-07-03 NOTE — Telephone Encounter (Signed)
Spoke w/ pt wife and informed her that pt remote transmission was received. Informed her that someone will look at the report and if there is any issue they will give her a call. Pt wife verbalized understanding.

## 2016-07-03 NOTE — Telephone Encounter (Signed)
Transmission received.

## 2016-07-03 NOTE — Telephone Encounter (Signed)
Follow-up      1. Has your device fired? no  2. Is you device beeping? no  3. Are you experiencing draining or swelling at device site? no  4. Are you calling to see if we received your device transmission? The wife is calling to see if the down load cam through  5. Have you passed out? no

## 2016-07-03 NOTE — Progress Notes (Signed)
Remote pacemaker transmission.   

## 2016-07-03 NOTE — Telephone Encounter (Signed)
New message       1. Has your device fired? no  2. Is you device beeping? no  3. Are you experiencing draining or swelling at device site? no  4. Are you calling to see if we received your device transmission? The family is going to down load signal  5. Have you passed out? no

## 2016-07-05 ENCOUNTER — Encounter: Payer: Self-pay | Admitting: Cardiology

## 2016-07-05 LAB — CUP PACEART REMOTE DEVICE CHECK
Battery Impedance: 204 Ohm
Battery Remaining Longevity: 115 mo
Brady Statistic AP VP Percent: 2 %
Brady Statistic AS VP Percent: 0 %
Brady Statistic AS VS Percent: 3 %
Implantable Lead Implant Date: 20070730
Implantable Lead Location: 753859
Implantable Lead Location: 753860
Implantable Lead Model: 5076
Lead Channel Impedance Value: 359 Ohm
Lead Channel Pacing Threshold Amplitude: 0.875 V
Lead Channel Pacing Threshold Amplitude: 1 V
Lead Channel Pacing Threshold Pulse Width: 0.4 ms
Lead Channel Sensing Intrinsic Amplitude: 8 mV
Lead Channel Setting Pacing Pulse Width: 0.4 ms
MDC IDC LEAD IMPLANT DT: 20070730
MDC IDC MSMT BATTERY VOLTAGE: 2.79 V
MDC IDC MSMT LEADCHNL RV IMPEDANCE VALUE: 597 Ohm
MDC IDC MSMT LEADCHNL RV PACING THRESHOLD PULSEWIDTH: 0.4 ms
MDC IDC SESS DTM: 20170712144638
MDC IDC SET LEADCHNL RA PACING AMPLITUDE: 1.75 V
MDC IDC SET LEADCHNL RV PACING AMPLITUDE: 2.5 V
MDC IDC SET LEADCHNL RV SENSING SENSITIVITY: 4 mV
MDC IDC STAT BRADY AP VS PERCENT: 95 %

## 2016-10-02 ENCOUNTER — Ambulatory Visit (INDEPENDENT_AMBULATORY_CARE_PROVIDER_SITE_OTHER): Payer: Medicare Other | Admitting: *Deleted

## 2016-10-02 DIAGNOSIS — I480 Paroxysmal atrial fibrillation: Secondary | ICD-10-CM

## 2016-10-02 NOTE — Progress Notes (Signed)
Remote pacemaker transmission.   

## 2016-10-03 ENCOUNTER — Encounter: Payer: Self-pay | Admitting: Cardiology

## 2016-11-01 LAB — CUP PACEART REMOTE DEVICE CHECK
Battery Impedance: 251 Ohm
Battery Voltage: 2.78 V
Brady Statistic AP VP Percent: 2 %
Brady Statistic AP VS Percent: 94 %
Brady Statistic AS VP Percent: 0 %
Date Time Interrogation Session: 20171011140400
Implantable Lead Implant Date: 20070730
Implantable Lead Location: 753859
Implantable Lead Location: 753860
Implantable Lead Model: 4092
Implantable Lead Model: 5076
Implantable Pulse Generator Implant Date: 20140306
Lead Channel Impedance Value: 364 Ohm
Lead Channel Pacing Threshold Amplitude: 1.125 V
Lead Channel Pacing Threshold Pulse Width: 0.4 ms
Lead Channel Setting Pacing Amplitude: 2.5 V
Lead Channel Setting Pacing Pulse Width: 0.4 ms
MDC IDC LEAD IMPLANT DT: 20070730
MDC IDC MSMT BATTERY REMAINING LONGEVITY: 103 mo
MDC IDC MSMT LEADCHNL RV IMPEDANCE VALUE: 545 Ohm
MDC IDC MSMT LEADCHNL RV PACING THRESHOLD AMPLITUDE: 1.125 V
MDC IDC MSMT LEADCHNL RV PACING THRESHOLD PULSEWIDTH: 0.4 ms
MDC IDC SET LEADCHNL RA PACING AMPLITUDE: 2.25 V
MDC IDC SET LEADCHNL RV SENSING SENSITIVITY: 4 mV
MDC IDC STAT BRADY AS VS PERCENT: 4 %

## 2016-11-18 ENCOUNTER — Ambulatory Visit (HOSPITAL_COMMUNITY)
Admission: RE | Admit: 2016-11-18 | Discharge: 2016-11-18 | Disposition: A | Discharge status: Home / Self Care | Payer: Medicare Other | Source: Ambulatory Visit | Attending: Cardiovascular Disease | Admitting: Cardiovascular Disease

## 2016-11-18 DIAGNOSIS — I2510 Atherosclerotic heart disease of native coronary artery without angina pectoris: Secondary | ICD-10-CM | POA: Insufficient documentation

## 2016-11-18 DIAGNOSIS — I10 Essential (primary) hypertension: Secondary | ICD-10-CM | POA: Diagnosis not present

## 2016-11-18 DIAGNOSIS — Z951 Presence of aortocoronary bypass graft: Secondary | ICD-10-CM | POA: Diagnosis not present

## 2016-11-18 DIAGNOSIS — Z87891 Personal history of nicotine dependence: Secondary | ICD-10-CM | POA: Diagnosis not present

## 2016-11-18 DIAGNOSIS — I6523 Occlusion and stenosis of bilateral carotid arteries: Secondary | ICD-10-CM

## 2016-11-19 ENCOUNTER — Telehealth: Payer: Self-pay

## 2016-11-19 DIAGNOSIS — I6523 Occlusion and stenosis of bilateral carotid arteries: Secondary | ICD-10-CM

## 2016-11-19 NOTE — Telephone Encounter (Signed)
-----   Message from Thurmon FairMihai Croitoru, MD sent at 11/18/2016  6:53 PM EST ----- Unchanged from last year. Stable 60-79% RICA stenosis. Stable 1-39% LICA stenosis Please repeat in 12 months

## 2016-11-19 NOTE — Telephone Encounter (Signed)
Called patient with results. Patient verbalized understanding. Order placed for repeat carotid duplex in 1 year. 

## 2017-01-01 ENCOUNTER — Encounter: Payer: Medicare Other | Admitting: *Deleted

## 2017-01-01 ENCOUNTER — Telehealth: Payer: Self-pay | Admitting: Cardiology

## 2017-01-01 NOTE — Telephone Encounter (Signed)
Confirmed remote transmission w/ pt wife. But pt is seeing MD on 02-05-17 so remote appt will be canceled. Pt aware.

## 2017-01-03 ENCOUNTER — Telehealth: Payer: Self-pay | Admitting: Cardiovascular Disease

## 2017-01-03 NOTE — Telephone Encounter (Signed)
Spoke to wife . She states ems is taking patient to [facility]. She states patient story would change with each question and he forgot he had any type heart issues.   informed wife would let dr croitoru know of the situtation. If [facility] needs any assistance they can call the on call cardiology doctor.   wished her well- voiced understatnding

## 2017-01-03 NOTE — Telephone Encounter (Signed)
New Message   Per pt wife pt fell, and is having trouble remembering. EMS currently in home, and will be transitioning pt to hospital.

## 2017-01-29 ENCOUNTER — Encounter: Payer: Medicare Other | Admitting: Cardiovascular Disease

## 2017-02-05 ENCOUNTER — Encounter: Payer: Self-pay | Admitting: Cardiovascular Disease

## 2017-02-05 ENCOUNTER — Ambulatory Visit (INDEPENDENT_AMBULATORY_CARE_PROVIDER_SITE_OTHER): Payer: Medicare Other | Admitting: Cardiovascular Disease

## 2017-02-05 VITALS — BP 110/70 | HR 83 | Ht 63.0 in | Wt 139.0 lb

## 2017-02-05 DIAGNOSIS — I495 Sick sinus syndrome: Secondary | ICD-10-CM

## 2017-02-05 DIAGNOSIS — Z950 Presence of cardiac pacemaker: Secondary | ICD-10-CM

## 2017-02-05 DIAGNOSIS — I2510 Atherosclerotic heart disease of native coronary artery without angina pectoris: Secondary | ICD-10-CM

## 2017-02-05 DIAGNOSIS — I6521 Occlusion and stenosis of right carotid artery: Secondary | ICD-10-CM | POA: Diagnosis not present

## 2017-02-05 DIAGNOSIS — I480 Paroxysmal atrial fibrillation: Secondary | ICD-10-CM | POA: Diagnosis not present

## 2017-02-05 DIAGNOSIS — Z952 Presence of prosthetic heart valve: Secondary | ICD-10-CM | POA: Diagnosis not present

## 2017-02-05 DIAGNOSIS — I471 Supraventricular tachycardia: Secondary | ICD-10-CM | POA: Diagnosis not present

## 2017-02-05 DIAGNOSIS — I5189 Other ill-defined heart diseases: Secondary | ICD-10-CM

## 2017-02-05 DIAGNOSIS — I519 Heart disease, unspecified: Secondary | ICD-10-CM

## 2017-02-05 DIAGNOSIS — Z531 Procedure and treatment not carried out because of patient's decision for reasons of belief and group pressure: Secondary | ICD-10-CM

## 2017-02-05 DIAGNOSIS — I4719 Other supraventricular tachycardia: Secondary | ICD-10-CM

## 2017-02-05 DIAGNOSIS — IMO0001 Reserved for inherently not codable concepts without codable children: Secondary | ICD-10-CM

## 2017-02-05 LAB — CUP PACEART INCLINIC DEVICE CHECK
Battery Voltage: 2.78 V
Brady Statistic AP VS Percent: 94 %
Brady Statistic AS VP Percent: 0 %
Brady Statistic AS VS Percent: 3 %
Date Time Interrogation Session: 20180214160956
Implantable Lead Implant Date: 20070730
Implantable Lead Implant Date: 20070730
Implantable Lead Location: 753860
Lead Channel Impedance Value: 351 Ohm
Lead Channel Impedance Value: 532 Ohm
Lead Channel Pacing Threshold Amplitude: 1 V
Lead Channel Pacing Threshold Amplitude: 1 V
Lead Channel Pacing Threshold Pulse Width: 0.4 ms
Lead Channel Pacing Threshold Pulse Width: 0.4 ms
Lead Channel Sensing Intrinsic Amplitude: 1.4 mV
Lead Channel Sensing Intrinsic Amplitude: 11.2 mV
Lead Channel Setting Pacing Amplitude: 2 V
Lead Channel Setting Pacing Amplitude: 2.5 V
MDC IDC LEAD LOCATION: 753859
MDC IDC MSMT BATTERY IMPEDANCE: 275 Ohm
MDC IDC MSMT BATTERY REMAINING LONGEVITY: 102 mo
MDC IDC MSMT LEADCHNL RA PACING THRESHOLD AMPLITUDE: 1 V
MDC IDC MSMT LEADCHNL RA PACING THRESHOLD PULSEWIDTH: 0.4 ms
MDC IDC MSMT LEADCHNL RA PACING THRESHOLD PULSEWIDTH: 0.4 ms
MDC IDC MSMT LEADCHNL RV PACING THRESHOLD AMPLITUDE: 1 V
MDC IDC PG IMPLANT DT: 20140306
MDC IDC SET LEADCHNL RV PACING PULSEWIDTH: 0.4 ms
MDC IDC SET LEADCHNL RV SENSING SENSITIVITY: 4 mV
MDC IDC STAT BRADY AP VP PERCENT: 2 %

## 2017-02-05 NOTE — Progress Notes (Signed)
Patient ID: [name], male   DOB: 04/12/1924, 83 y.o.   MRN: [id number]    Cardiology Office Note    Date:  02/05/2017   ID:  [name], DOB Dec 03, 1924, MRN [id number]  PCP:  Romeo Rabon, MD  Cardiologist:   Thurmon Fair, MD   Chief Complaint  Patient presents with  . Follow-up    History of Present Illness:  [name] is a 83 y.o. male with a long-standing history of coronary, valvular and arrhythmic cardiac problems. He returns for yearly clinical and pacemaker follow-up.  Last April he fell and suffered a hip fracture and had to undergo surgery. He is now using a walker. He has not had any falls since.  He has not had syncope and denies palpitations. He denies lower extremity edema, claudication or focal neurological complaints. He does complain of bilateral hip pain, the pattern is not consistent with intermittent claudication.  He presents today in atrial paced ventricular sensed rhythm which is what his rhythm is almost all the time. His current pacemaker generator implanted in 2014 has another 8.5 years of longevity. Both leads were placed in 2007 and have excellent parameters. He has 96.6% atrial pacing and only 2.6% ventricular pacing. About 2 years ago he had a single episode of atrial tachycardia that lasted for about 5 minutes. On 04/20/2016 he had a similar episode of ectopic atrial tachycardia that lasted for about 10 minutes. This happened during his hospitalization for his hip fracture. There has been no atrial fibrillation. He has had occasional bursts of nonsustained ventricular tachycardia, the longest consisting of roughly 10 beats. All the arrhythmic events were asymptomatic.  He sees Dr. Arlyce Dice and the [facility] and has had blood work, but I don't yet have those results. 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  He has a long-standing history of multiple cardiac problems. In 2004 he underwent multivessel bypass surgery (LIMA to LAD, SVG to  diagonal, SVG to circumflex, SVG to RCA) and received a 21 mm pericardal aortic valve prosthesis, by Dr. Donata Clay. 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 He had postoperative atrial fibrillation.  In 2007 he had right coronary artery graft disease that required stenting with 3 consecutive Cypher drug-eluting stents all 3.5 in diameter, cumulative length of 79 mm.  The last echo evaluation of his prosthetic valve was in September of 2014. The peak and mean gradients were excellent at 13 and 8 mm Hg respectively. There was no significant aortic insufficiency. Left ventricular systolic function was borderline low at 50-55% with inferior and inferolateral hypokinesis.  In 2004, he received a dual-chamber permanent pacemaker for sinus bradycardia. He received a new generator Radiation protection practitioner) in March of 2014.   He has treated HTN and hyperlipidemia on Zetia (statin intolerant due to myalgia).  Carotid ultrasonography performed November 2017 showed age from 2016 (60-79% right internal carotid stenosis, 1-39%left internal carotid artery stenosis)  Past Medical History:  Diagnosis Date  . Anemia, chronic disease   . Aortic stenosis 2004   tissue AVR  . CAD (coronary artery disease) 2004   CABG X 4  . History of nephrolithiasis   . HTN (hypertension)   . Pacemaker 2007, March 2013   MDT  . RBBB   . Refusal of blood transfusions as patient is Jehovah's Witness     Past Surgical History:  Procedure Laterality Date  . AORTIC VALVE REPLACEMENT  2004  . CARDIOVASCULAR STRESS TEST  04/30/2012   EF 49%, inferior lateral hypokinesis-akinesis no reversible ischemia, large fixed inferior lateral scar  . CHOLECYSTECTOMY  1993  . CORONARY ANGIOPLASTY WITH STENT PLACEMENT  2007   Occluded DG-RCA ,Native RCA, overlapping DES stents  . CORONARY ARTERY BYPASS GRAFT  2004   LIMA-LAD; VT-diagonal; VG-circumflex; VG-RCA  . DENTAL  RESTORATION/EXTRACTION WITH X-RAY  1965  . PACEMAKER INSERTION  2007  . PERMANENT PACEMAKER GENERATOR CHANGE  March 2013   MDT  . PERMANENT PACEMAKER GENERATOR CHANGE N/A 02/25/2013   Procedure: PERMANENT PACEMAKER GENERATOR CHANGE;  Surgeon: Thurmon FairMihai Johnie Makki, MD;  Location: [facility] CATH [facility];  Service: Cardiovascular;  Laterality: N/A;  . TRANSTHORACIC ECHOCARDIOGRAM  09/08/13    EF 50-55% moderate hypokinesis of the inferior lateral myocardium, grade 1 diastolic dysfunction, aortic valve bioprosthetic with mild regurg valve area 1.7 mild mitral regurg     Outpatient Medications Prior to Visit  Medication Sig Dispense Refill  . clopidogrel (PLAVIX) 75 MG tablet Take 75 mg by mouth every morning.    . ezetimibe (ZETIA) 10 MG tablet Take 1 tablet (10 mg total) by mouth daily. 90 tablet 3  . fluorouracil (EFUDEX) 5 % cream Apply 1 application topically 2 (two) times daily. Applies to nose.    . hydrochlorothiazide (HYDRODIURIL) 25 MG tablet Take 1 tablet (25 mg total) by mouth daily. (Patient taking differently: Take 12.5 mg by mouth daily. ) 90 tablet 0  . metFORMIN (GLUCOPHAGE) 500 MG tablet Take 500 mg by mouth daily with breakfast.    . metoprolol (LOPRESSOR) 50 MG tablet Take 50 mg by mouth 2 (two) times daily.    . nitroGLYCERIN (NITROSTAT) 0.4 MG SL tablet Place 0.4 mg under the tongue every 5 (five) minutes as needed for chest pain.    Marland Kitchen. omeprazole (PRILOSEC) 20 MG capsule Take 20 mg by mouth every morning.    . Vitamin D, Ergocalciferol, (DRISDOL) 50000 UNITS CAPS Take 50,000 Units by mouth 2 (two) times a week. Takes 1 capsule on Mondays mornings and 1 capsule on Friday mornings.    Marland Kitchen. diltiazem (CARDIZEM CD) 180 MG 24 hr capsule Take 1 capsule (180 mg total) by mouth daily. (Patient not taking: Reported on 02/05/2017) 90 capsule 1  . fexofenadine (ALLEGRA) 180 MG tablet Take 180 mg by mouth as needed for allergies or rhinitis.    Marland Kitchen. isosorbide mononitrate (IMDUR) 30 MG 24 hr tablet Take 0.5 tablets  (15 mg total) by mouth daily. (Patient not taking: Reported on 02/05/2017) 45 tablet 0  . spironolactone (ALDACTONE) 25 MG tablet Take 1 tablet (25 mg total) by mouth every morning. (Patient not taking: Reported on 02/05/2017) 90 tablet 1   No facility-administered medications prior to visit.      Allergies:   Sular [nisoldipine er]; Amlodipine; Bactrim [sulfamethoxazole-trimethoprim]; Celebrex [celecoxib]; Hytrin [terazosin]; Indocin [indomethacin]; Lopid [gemfibrozil]; Maxzide [hydrochlorothiazide w-triamterene]; Niacin and related; Norvasc [amlodipine besylate]; Penicillins; Procardia [nifedipine]; Tekturna [aliskiren]; Vasotec [enalapril maleate]; Vasotec [enalapril]; Amiodarone; Keflex [cephalexin]; Lescol [fluvastatin]; Nisoldipine; Pravastatin; Statins; and Zocor [simvastatin]   Social History   Social History  . Marital status: Married    Spouse name: N/A  . Number of children: N/A  . Years of education: N/A   Occupational History  . retired     Personnel officertv master tech   Social History Main Topics  . Smoking status: Former Smoker    Quit date: 10/21/1950  . Smokeless tobacco: Not on file  . Alcohol use Not on file  .  Drug use: Unknown  . Sexual activity: Not on file   Other Topics Concern  . Not on file   Social History Narrative  . No narrative on file     Family History:  The patient's family history includes Cancer in his mother; Heart disease in his brother, father, and mother; Pneumonia in his sister.   ROS:   Please see the history of present illness.    ROS All other systems reviewed and are negative.   PHYSICAL EXAM:   VS:  BP 110/70   Pulse 83   Ht 5\' 3"  (1.6 m)   Wt 63 kg (139 lb)   BMI 24.62 kg/m    GEN: Well nourished, well developed, in no acute distress  HEENT: normal  Neck: no JVD, faint bilateral carotid bruits, no masses Cardiac: RRR; 2/6 early peaking aortic ejection murmur, no diastolic murmurs, rubs, or gallops,no edema  Respiratory:  clear to  auscultation bilaterally, normal work of breathing GI: soft, nontender, nondistended, + BS MS: no deformity or atrophy  Skin: warm and dry, no rash Neuro:  Alert and Oriented x 3, Strength and sensation are intact Psych: euthymic mood, full affect  Wt Readings from Last 3 Encounters:  02/05/17 63 kg (139 lb)  01/26/16 63.2 kg (139 lb 6 oz)  12/20/14 65.5 kg (144 lb 8 oz)      Studies/Labs Reviewed:   EKG:  Ordered today, shows atrial paced, ventricular paced rhythm with a single PVC   Recent Labs: No results found for requested labs within last 8760 hours.   Lipid Panel    Component Value Date/Time   CHOL  04/09/2008 0458    152        ATP III CLASSIFICATION:  <200     mg/dL   Desirable  409-811  mg/dL   Borderline High  >=914    mg/dL   High   TRIG 80 78/29/5621 0458   HDL 27 (L) 04/09/2008 0458   CHOLHDL 5.6 04/09/2008 0458   VLDL 16 04/09/2008 0458   LDLCALC (H) 04/09/2008 0458    109        Total Cholesterol/HDL:CHD Risk Coronary Heart Disease Risk Table                     Men   Women  1/2 Average Risk   3.4   3.3     ASSESSMENT:    1. SSS (sick sinus syndrome) (HCC)   2. PAT (paroxysmal atrial tachycardia) (HCC)   3. PAF (paroxysmal atrial fibrillation) (HCC)   4. Pacemaker- MDT '07, gen change March 2014   5. Coronary artery disease involving native coronary artery of native heart without angina pectoris   6. Aortic valve prosthesis present- Tissue AVR '04   7. Diastolic dysfunction, grade 1 with EF 50-55% Sept 2014   8. Stenosis of right carotid artery   9. Refusal of blood transfusions as patient is Jehovah's Witness      PLAN:  In order of problems listed above:  1. SSS: Heart rate histograms are quite blunted, but he is very sedentary, walks using a walker. I don't think adjustments need to be made to his device. 2. PAT: Only one significant episode has occurred in the last year and this was associated with his hip fracture. The rhythm was  clearly regular, not atrial fibrillation. 3. PAF: Episodes of atrial fibrillation are extremely infrequent. None have been recorded at least since July 2014. He has chronic anemia  and is a Scientist, product/process development. Despite high estimated embolic risk (CHADSVasc 4 - age 23, CAD, HTN), he has never had a stroke or transient ischemic attack. He is very leery of full anticoagulation. We'll continue current antiplatelet regimen. 4. PPM: Normal pacemaker function. No programming changes made. Continue remote downloads every 3 months and yearly follow-up. 5. CAD s/p CABG: Asymptomatic, free of angina 6. S/p AVR: No clinical evidence of problems with the prosthesis. Normal function on echo in 2014. 7. Diastolic dysfunction without clinical heart failure 8. Right carotid stenosis: Moderate RICA stenosis, stable in 60-79% range. Minimal 1-39% LICA stenosis. In the absence of clinical neurological events, continue with statin, clopidogrel. 9. Jehovah's Witness refusing blood products    Medication Adjustments/Labs and Tests Ordered: Current medicines are reviewed at length with the patient today.  Concerns regarding medicines are outlined above.  Medication changes, Labs and Tests ordered today are listed in the Patient Instructions below. Patient Instructions  Dr Royann Shivers recommends that you continue on your current medications as directed. Please refer to the Current Medication list given to you today.  Remote monitoring is used to monitor your Pacemaker of ICD from home. This monitoring reduces the number of office visits required to check your device to one time per year. It allows [place] to keep an eye on the functioning of your device to ensure it is working properly. You are scheduled for a device check from home on Wednesday, May 16th, 2018. You may send your transmission at any time that day. If you have a wireless device, the transmission will be sent automatically. After your physician reviews your transmission,  you will receive a postcard with your next transmission date.  Dr Royann Shivers recommends that you schedule a follow-up appointment in 12 months with a pacemaker check. You will receive a reminder letter in the mail two months in advance. If you don't receive a letter, please call our office to schedule the follow-up appointment.  If you need a refill on your cardiac medications before your next appointment, please call your pharmacy.      Signed, Thurmon Fair, MD  02/05/2017 3:33 PM    [facility] [address] [place], [place], [place]  [address] Phone: [phone]; Fax: [phone]

## 2017-02-05 NOTE — Patient Instructions (Signed)
Dr Croitoru recommends that you continue on your current medications as directed. Please refer to the Current Medication list given to you today.  Remote monitoring is used to monitor your Pacemaker of ICD from home. This monitoring reduces the number of office visits required to check your device to one time per year. It allows us to keep an eye on the functioning of your device to ensure it is working properly. You are scheduled for a device check from home on Wednesday, May 16th, 2018. You may send your transmission at any time that day. If you have a wireless device, the transmission will be sent automatically. After your physician reviews your transmission, you will receive a postcard with your next transmission date.  Dr Croitoru recommends that you schedule a follow-up appointment in 12 months with a pacemaker check. You will receive a reminder letter in the mail two months in advance. If you don't receive a letter, please call our office to schedule the follow-up appointment.  If you need a refill on your cardiac medications before your next appointment, please call your pharmacy. 

## 2017-05-07 ENCOUNTER — Ambulatory Visit (INDEPENDENT_AMBULATORY_CARE_PROVIDER_SITE_OTHER): Payer: Medicare Other | Admitting: *Deleted

## 2017-05-07 ENCOUNTER — Telehealth: Payer: Self-pay | Admitting: Cardiology

## 2017-05-07 DIAGNOSIS — I495 Sick sinus syndrome: Secondary | ICD-10-CM

## 2017-05-07 NOTE — Telephone Encounter (Signed)
LMOVM reminding pt to send remote transmission.   

## 2017-05-08 ENCOUNTER — Encounter: Payer: Self-pay | Admitting: Cardiology

## 2017-05-08 LAB — CUP PACEART REMOTE DEVICE CHECK
Battery Impedance: 275 Ohm
Battery Voltage: 2.78 V
Brady Statistic AP VP Percent: 3 %
Brady Statistic AS VP Percent: 0 %
Date Time Interrogation Session: 20180516151948
Implantable Lead Implant Date: 20070730
Implantable Lead Location: 753860
Implantable Lead Model: 5076
Implantable Pulse Generator Implant Date: 20140306
Lead Channel Impedance Value: 346 Ohm
Lead Channel Pacing Threshold Amplitude: 0.875 V
Lead Channel Pacing Threshold Amplitude: 1 V
Lead Channel Pacing Threshold Pulse Width: 0.4 ms
Lead Channel Setting Pacing Amplitude: 2.5 V
MDC IDC LEAD IMPLANT DT: 20070730
MDC IDC LEAD LOCATION: 753859
MDC IDC MSMT BATTERY REMAINING LONGEVITY: 105 mo
MDC IDC MSMT LEADCHNL RV IMPEDANCE VALUE: 605 Ohm
MDC IDC MSMT LEADCHNL RV PACING THRESHOLD PULSEWIDTH: 0.4 ms
MDC IDC SET LEADCHNL RA PACING AMPLITUDE: 1.75 V
MDC IDC SET LEADCHNL RV PACING PULSEWIDTH: 0.4 ms
MDC IDC SET LEADCHNL RV SENSING SENSITIVITY: 4 mV
MDC IDC STAT BRADY AP VS PERCENT: 97 %
MDC IDC STAT BRADY AS VS PERCENT: 0 %

## 2017-05-08 NOTE — Progress Notes (Signed)
Remote pacemaker transmission.   

## 2017-08-06 ENCOUNTER — Ambulatory Visit (INDEPENDENT_AMBULATORY_CARE_PROVIDER_SITE_OTHER): Payer: Medicare Other | Admitting: *Deleted

## 2017-08-06 DIAGNOSIS — I480 Paroxysmal atrial fibrillation: Secondary | ICD-10-CM

## 2017-08-06 DIAGNOSIS — I495 Sick sinus syndrome: Secondary | ICD-10-CM

## 2017-08-06 NOTE — Progress Notes (Signed)
Remote pacemaker check. 

## 2017-08-07 LAB — CUP PACEART REMOTE DEVICE CHECK
Battery Remaining Longevity: 103 mo
Battery Voltage: 2.78 V
Brady Statistic AS VP Percent: 0 %
Brady Statistic AS VS Percent: 1 %
Date Time Interrogation Session: 20180815145408
Implantable Lead Implant Date: 20070730
Implantable Lead Implant Date: 20070730
Implantable Lead Location: 753859
Implantable Lead Location: 753860
Implantable Lead Model: 4092
Implantable Pulse Generator Implant Date: 20140306
Lead Channel Pacing Threshold Amplitude: 0.875 V
Lead Channel Pacing Threshold Amplitude: 1 V
Lead Channel Pacing Threshold Pulse Width: 0.4 ms
Lead Channel Setting Pacing Amplitude: 1.75 V
Lead Channel Setting Pacing Amplitude: 2.5 V
Lead Channel Setting Pacing Pulse Width: 0.4 ms
MDC IDC MSMT BATTERY IMPEDANCE: 299 Ohm
MDC IDC MSMT LEADCHNL RA IMPEDANCE VALUE: 351 Ohm
MDC IDC MSMT LEADCHNL RA PACING THRESHOLD PULSEWIDTH: 0.4 ms
MDC IDC MSMT LEADCHNL RV IMPEDANCE VALUE: 555 Ohm
MDC IDC SET LEADCHNL RV SENSING SENSITIVITY: 4 mV
MDC IDC STAT BRADY AP VP PERCENT: 3 %
MDC IDC STAT BRADY AP VS PERCENT: 97 %

## 2017-08-19 ENCOUNTER — Encounter: Payer: Self-pay | Admitting: Cardiology

## 2017-11-05 ENCOUNTER — Ambulatory Visit (INDEPENDENT_AMBULATORY_CARE_PROVIDER_SITE_OTHER): Payer: Medicare Other | Admitting: *Deleted

## 2017-11-05 DIAGNOSIS — I495 Sick sinus syndrome: Secondary | ICD-10-CM

## 2017-11-05 NOTE — Progress Notes (Signed)
Remote pacemaker transmission.   

## 2017-11-06 ENCOUNTER — Encounter (HOSPITAL_COMMUNITY): Payer: Medicare Other

## 2017-11-06 LAB — CUP PACEART REMOTE DEVICE CHECK
Brady Statistic AP VP Percent: 3 %
Brady Statistic AP VS Percent: 96 %
Brady Statistic AS VP Percent: 0 %
Date Time Interrogation Session: 20181114153823
Implantable Lead Implant Date: 20070730
Implantable Lead Location: 753859
Implantable Lead Location: 753860
Implantable Lead Model: 5076
Lead Channel Impedance Value: 355 Ohm
Lead Channel Impedance Value: 564 Ohm
Lead Channel Pacing Threshold Amplitude: 0.875 V
Lead Channel Pacing Threshold Amplitude: 1 V
Lead Channel Pacing Threshold Pulse Width: 0.4 ms
Lead Channel Pacing Threshold Pulse Width: 0.4 ms
MDC IDC LEAD IMPLANT DT: 20070730
MDC IDC MSMT BATTERY IMPEDANCE: 323 Ohm
MDC IDC MSMT BATTERY REMAINING LONGEVITY: 100 mo
MDC IDC MSMT BATTERY VOLTAGE: 2.78 V
MDC IDC PG IMPLANT DT: 20140306
MDC IDC SET LEADCHNL RA PACING AMPLITUDE: 1.75 V
MDC IDC SET LEADCHNL RV PACING AMPLITUDE: 2.5 V
MDC IDC SET LEADCHNL RV PACING PULSEWIDTH: 0.4 ms
MDC IDC SET LEADCHNL RV SENSING SENSITIVITY: 4 mV
MDC IDC STAT BRADY AS VS PERCENT: 1 %

## 2017-11-07 ENCOUNTER — Encounter: Payer: Self-pay | Admitting: Cardiology

## 2017-11-27 ENCOUNTER — Inpatient Hospital Stay (HOSPITAL_COMMUNITY)
Admission: RE | Admit: 2017-11-27 | Payer: Medicare Other | Source: Ambulatory Visit | Attending: Cardiovascular Disease | Admitting: Cardiovascular Disease

## 2017-12-10 ENCOUNTER — Telehealth: Payer: Self-pay | Admitting: Cardiovascular Disease

## 2017-12-10 DIAGNOSIS — I6523 Occlusion and stenosis of bilateral carotid arteries: Secondary | ICD-10-CM

## 2017-12-10 NOTE — Telephone Encounter (Signed)
New Message  Pt wife called requesting to speak with RN to see if pt would be able to get carotid completed at the [facility] in [place], [place]. Please call back to discuss

## 2017-12-10 NOTE — Telephone Encounter (Signed)
Spoke with pt wife, they are not able to make the carotid appointment due to transportation issues. They would like to do the testing in [place]. Spoke with scheduling at [facility]. Copy of patient demographics and order for carotid dopplers faxed to [phone]. They are going to contact the patient to schedule.

## 2017-12-11 ENCOUNTER — Ambulatory Visit (HOSPITAL_COMMUNITY): Admission: RE | Admit: 2017-12-11 | Payer: Medicare Other | Source: Ambulatory Visit

## 2018-02-04 ENCOUNTER — Ambulatory Visit (INDEPENDENT_AMBULATORY_CARE_PROVIDER_SITE_OTHER): Payer: Medicare Other | Admitting: *Deleted

## 2018-02-04 DIAGNOSIS — I495 Sick sinus syndrome: Secondary | ICD-10-CM

## 2018-02-04 NOTE — Progress Notes (Signed)
Remote pacemaker transmission.   

## 2018-02-05 ENCOUNTER — Encounter: Payer: Self-pay | Admitting: Cardiology

## 2018-02-11 ENCOUNTER — Telehealth: Payer: Self-pay | Admitting: Cardiovascular Disease

## 2018-02-11 NOTE — Telephone Encounter (Signed)
Spoke with Ploverhelley, CMA. Carotid doppler results are not yet received. Relayed this to wife, who states she was under the impression that the results were sent. Patient had MD OV on Feb 25. She asked that I call [facility] [phone] and request them be sent. Called this facility and LM for medical records to have report sent to our office.

## 2018-02-11 NOTE — Telephone Encounter (Signed)
New message  Pt wife verbalized that she is calling for the RN  To see if Dr.C received his results from his Carotid that he had   Done at an imaging center in [place]

## 2018-02-14 LAB — CUP PACEART REMOTE DEVICE CHECK
Battery Remaining Longevity: 101 mo
Battery Voltage: 2.78 V
Date Time Interrogation Session: 20190213162041
Implantable Lead Implant Date: 20070730
Implantable Lead Location: 753859
Implantable Lead Location: 753860
Implantable Pulse Generator Implant Date: 20140306
Lead Channel Impedance Value: 351 Ohm
Lead Channel Pacing Threshold Amplitude: 0.75 V
Lead Channel Pacing Threshold Amplitude: 1 V
Lead Channel Pacing Threshold Pulse Width: 0.4 ms
Lead Channel Setting Pacing Amplitude: 1.5 V
Lead Channel Setting Pacing Pulse Width: 0.4 ms
MDC IDC LEAD IMPLANT DT: 20070730
MDC IDC MSMT BATTERY IMPEDANCE: 347 Ohm
MDC IDC MSMT LEADCHNL RA PACING THRESHOLD PULSEWIDTH: 0.4 ms
MDC IDC MSMT LEADCHNL RV IMPEDANCE VALUE: 615 Ohm
MDC IDC SET LEADCHNL RV PACING AMPLITUDE: 2.5 V
MDC IDC SET LEADCHNL RV SENSING SENSITIVITY: 4 mV
MDC IDC STAT BRADY AP VP PERCENT: 3 %
MDC IDC STAT BRADY AP VS PERCENT: 96 %
MDC IDC STAT BRADY AS VP PERCENT: 0 %
MDC IDC STAT BRADY AS VS PERCENT: 1 %

## 2018-02-16 ENCOUNTER — Ambulatory Visit (INDEPENDENT_AMBULATORY_CARE_PROVIDER_SITE_OTHER): Payer: Medicare Other | Admitting: Cardiovascular Disease

## 2018-02-16 ENCOUNTER — Encounter: Payer: Self-pay | Admitting: Cardiovascular Disease

## 2018-02-16 VITALS — BP 122/62 | HR 71 | Ht 63.0 in | Wt 142.4 lb

## 2018-02-16 DIAGNOSIS — I6521 Occlusion and stenosis of right carotid artery: Secondary | ICD-10-CM

## 2018-02-16 DIAGNOSIS — I495 Sick sinus syndrome: Secondary | ICD-10-CM | POA: Diagnosis not present

## 2018-02-16 DIAGNOSIS — Z950 Presence of cardiac pacemaker: Secondary | ICD-10-CM | POA: Diagnosis not present

## 2018-02-16 DIAGNOSIS — Z953 Presence of xenogenic heart valve: Secondary | ICD-10-CM

## 2018-02-16 DIAGNOSIS — I471 Supraventricular tachycardia: Secondary | ICD-10-CM | POA: Diagnosis not present

## 2018-02-16 DIAGNOSIS — I25810 Atherosclerosis of coronary artery bypass graft(s) without angina pectoris: Secondary | ICD-10-CM

## 2018-02-16 DIAGNOSIS — I4719 Other supraventricular tachycardia: Secondary | ICD-10-CM

## 2018-02-16 DIAGNOSIS — I519 Heart disease, unspecified: Secondary | ICD-10-CM | POA: Diagnosis not present

## 2018-02-16 DIAGNOSIS — I480 Paroxysmal atrial fibrillation: Secondary | ICD-10-CM | POA: Diagnosis not present

## 2018-02-16 DIAGNOSIS — I5189 Other ill-defined heart diseases: Secondary | ICD-10-CM

## 2018-02-16 DIAGNOSIS — IMO0001 Reserved for inherently not codable concepts without codable children: Secondary | ICD-10-CM

## 2018-02-16 DIAGNOSIS — Z531 Procedure and treatment not carried out because of patient's decision for reasons of belief and group pressure: Secondary | ICD-10-CM

## 2018-02-16 NOTE — Patient Instructions (Signed)
Dr Croitoru recommends that you schedule a follow-up appointment in 12 months. You will receive a reminder letter in the mail two months in advance. If you don't receive a letter, please call our office to schedule the follow-up appointment.  If you need a refill on your cardiac medications before your next appointment, please call your pharmacy. 

## 2018-02-16 NOTE — Progress Notes (Signed)
Patient ID: GAUTHAM HEWINS, male   DOB: 09/30/1924, 84 y.o.   MRN: [id number]    Cardiology Office Note    Date:  02/16/2018   ID:  JEHAD BISONO, DOB [birth year], MRN [id number]  PCP:  Romeo Rabon, MD  Cardiologist:   Thurmon Fair, MD   Chief Complaint  Patient presents with  . Follow-up  . Edema    sometimes in his lower legs and feet    History of Present Illness:  CAITLIN HILLMER is a 84 y.o. male with a long-standing history of coronary, valvular and arrhythmic cardiac problems. He returns for yearly clinical and pacemaker follow-up.  He walked in using a rolling walker (ever since his hip fracture).  Padraic and his wife have missed several appointments due to her health problems.  She had a very slow recovery after sepsis due to Klebsiella pneumonia.  He has not had syncope and denies palpitations. He denies lower extremity edema, claudication or focal neurological complaints. He does complain of bilateral hip pain, the pattern is not consistent with intermittent claudication.  He presents today in atrial paced ventricular sensed rhythm which is what his rhythm is almost all the time.  Just submitted a remote pacemaker download on February 13.  His current pacemaker generator (Medtronic Adapta) implanted in 2014 has another 8.5 years of longevity. Both leads were placed in 2007 and have excellent parameters. He has 99.4% atrial pacing and only 3.2% ventricular pacing.  Heart rate histogram distribution is very blunted.  As before, his device has recorded to both paroxysmal atrial tachycardia and nonsustained ventricular tachycardia.  Consistently asymptomatic.  His episode of nonsustained VT was fairly long, 30 beats.  Occurred on December 16, likely he was already asleep.  He sees Dr. Arlyce Dice at the [facility]. 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  He has a long-standing history of multiple cardiac problems. In 2004 he underwent multivessel bypass surgery (LIMA to LAD,  SVG to diagonal, SVG to circumflex, SVG to RCA) and received a 21 mm pericardal aortic valve prosthesis, by Dr. Donata Clay. 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 He had postoperative atrial fibrillation. In 2007 he had right coronary artery graft disease that required stenting with 3 consecutive Cypher drug-eluting stents all 3.5 in diameter, cumulative length of 79 mm. The last echo evaluation of his prosthetic valve was in September of 2014. The peak and mean gradients were excellent at 13 and 8 mm Hg respectively. There was no significant aortic insufficiency. Left ventricular systolic function was borderline low at 50-55% with inferior and inferolateral hypokinesis. In 2004, he received a dual-chamber permanent pacemaker for sinus bradycardia. He received a new generator Radiation protection practitioner) in March of 2014.  He has treated HTN and hyperlipidemia on Zetia (statin intolerant due to myalgia). Carotid ultrasonography performed November 2017 showed no change from 2016 (60-79% right internal carotid stenosis, 1-39%left internal carotid artery stenosis).  Repeat carotid ultrasound performed at another institution in 2019 shows bilateral mild plaque, less than 49% stenosis.  Past Medical History:  Diagnosis Date  . Anemia, chronic disease   . Aortic stenosis 2004   tissue AVR  . CAD (coronary artery disease) 2004   CABG X 4  . History of nephrolithiasis   . HTN (hypertension)   . Pacemaker 2007, March 2013   MDT  . RBBB   . Refusal of blood transfusions as patient is Jehovah's Witness  Outpatient Medications Prior to Visit  Medication Sig Dispense Refill  . acetaminophen (TYLENOL) 650 MG CR tablet Take 650 mg by mouth 2 (two) times daily.    . clopidogrel (PLAVIX) 75 MG tablet Take 75 mg by mouth every morning.    . dorzolamide (TRUSOPT) 2 % ophthalmic solution Place 1 drop into both eyes 3 (three) times daily.    Marland Kitchen  erythromycin ophthalmic ointment Place 1 application into both eyes daily as needed.   3  . escitalopram (LEXAPRO) 10 MG tablet Take 1 tablet by mouth daily.    Marland Kitchen ezetimibe (ZETIA) 10 MG tablet Take 1 tablet (10 mg total) by mouth daily. 90 tablet 3  . fluorouracil (EFUDEX) 5 % cream Apply 1 application topically 2 (two) times daily. Applies to nose.    . hydrochlorothiazide (HYDRODIURIL) 25 MG tablet Take 12.5 mg by mouth every other day.     . loratadine (CLARITIN) 10 MG tablet Take 10 mg by mouth daily.    . metFORMIN (GLUCOPHAGE) 500 MG tablet Take 500 mg by mouth daily with breakfast.    . metoprolol (LOPRESSOR) 50 MG tablet Take 25 mg by mouth 2 (two) times daily.     Marland Kitchen omeprazole (PRILOSEC) 20 MG capsule Take 20 mg by mouth every morning.    Bertram Gala Glycol-Propyl Glycol (SYSTANE PRESERVATIVE FREE OP) Place 1-2 drops into both eyes 3 (three) times daily.    Marland Kitchen senna (SENOKOT) 8.6 MG TABS tablet Take 1 tablet by mouth daily.    . brimonidine (ALPHAGAN) 0.2 % ophthalmic solution     . diphenhydrAMINE (BENADRYL) 25 mg capsule Take 0.25 mg by mouth every 6 (six) hours as needed.    . hydrochlorothiazide (HYDRODIURIL) 25 MG tablet Take 1 tablet (25 mg total) by mouth daily. (Patient not taking: Reported on 02/16/2018) 90 tablet 0  . hydrochlorothiazide (MICROZIDE) 12.5 MG capsule Take 12.5 mg by mouth daily.    . nitroGLYCERIN (NITROSTAT) 0.4 MG SL tablet Place 0.4 mg under the tongue every 5 (five) minutes as needed for chest pain.    . Vitamin D, Ergocalciferol, (DRISDOL) 50000 UNITS CAPS Take 50,000 Units by mouth 2 (two) times a week. Takes 1 capsule on Mondays mornings and 1 capsule on Friday mornings.     No facility-administered medications prior to visit.      Allergies:   Sular [nisoldipine er]; Amlodipine; Bactrim [sulfamethoxazole-trimethoprim]; Celebrex [celecoxib]; Hytrin [terazosin]; Indocin [indomethacin]; Lopid [gemfibrozil]; Maxzide [hydrochlorothiazide w-triamterene]; Niacin  and related; Norvasc [amlodipine besylate]; Penicillins; Procardia [nifedipine]; Tekturna [aliskiren]; Vasotec [enalapril maleate]; Vasotec [enalapril]; Amiodarone; Keflex [cephalexin]; Lescol [fluvastatin]; Nisoldipine; Pravastatin; Statins; and Zocor [simvastatin]   Social History   Socioeconomic History  . Marital status: Married    Spouse name: None  . Number of children: None  . Years of education: None  . Highest education level: None  Social Needs  . Financial resource strain: None  . Food insecurity - worry: None  . Food insecurity - inability: None  . Transportation needs - medical: None  . Transportation needs - non-medical: None  Occupational History  . Occupation: retired    Comment: Personnel officer  Tobacco Use  . Smoking status: Former Smoker    Last attempt to quit: 10/21/1950    Years since quitting: 67.3  . Smokeless tobacco: Never Used  Substance and Sexual Activity  . Alcohol use: None  . Drug use: None  . Sexual activity: None  Other Topics Concern  . None  Social History Narrative  .  None     Family History:  The patient's family history includes Cancer in his mother; Heart disease in his brother, father, and mother; Pneumonia in his sister.   ROS:   Please see the history of present illness.    ROS All other systems reviewed and are negative.   PHYSICAL EXAM:   VS:  BP 122/62 (BP Location: Right Arm, Patient Position: Sitting, Cuff Size: Normal)   Pulse 71   Ht 5\' 3"  (1.6 m)   Wt 142 lb 6.4 oz (64.6 kg)   BMI 25.23 kg/m     General: Alert, oriented x3, no distress, lean, rather frail Head: no evidence of trauma, PERRL, EOMI, no exophtalmos or lid lag, no myxedema, no xanthelasma; normal ears, nose and oropharynx Neck: normal jugular venous pulsations and no hepatojugular reflux; brisk carotid pulses without delay and no carotid bruits Chest: clear to auscultation, no signs of consolidation by percussion or palpation, normal fremitus,  symmetrical and full respiratory excursions Cardiovascular: normal position and quality of the apical impulse, regular rhythm, widely split second heart sound, 2/6 early peaking aortic ejection murmur, no diastolic murmurs, rubs, or gallops,no edema.  Healthy left subclavian pacemaker site Abdomen: no tenderness or distention, no masses by palpation, no abnormal pulsatility or arterial bruits, normal bowel sounds, no hepatosplenomegaly Extremities: no clubbing, cyanosis or edema; 2+ radial, ulnar and brachial pulses bilaterally; 2+ right femoral, posterior tibial and dorsalis pedis pulses; 2+ left femoral, posterior tibial and dorsalis pedis pulses; no subclavian or femoral bruits Neurological: grossly nonfocal Psych: Normal mood and affect   Wt Readings from Last 3 Encounters:  02/16/18 142 lb 6.4 oz (64.6 kg)  02/05/17 139 lb (63 kg)  01/26/16 139 lb 6 oz (63.2 kg)      Studies/Labs Reviewed:   EKG:  Ordered today, shows atrial paced, ventricular sensed rhythm, Q waves in leads III and aVF right bundle branch block, T wave inversion V3-V6.  QRS 142 ms, QTC 465 ms.  Recent Labs: No results found for requested labs within last 8760 hours.   Lipid Panel    Component Value Date/Time   CHOL  04/09/2008 0458    152        ATP III CLASSIFICATION:  <200     mg/dL   Desirable  [id number]  mg/dL   Borderline High  >=045>=240    mg/dL   High   TRIG 80 40/98/119104/18/2009 0458   HDL 27 (L) 04/09/2008 0458   CHOLHDL 5.6 04/09/2008 0458   VLDL 16 04/09/2008 0458   LDLCALC (H) 04/09/2008 0458    109        Total Cholesterol/HDL:CHD Risk Coronary Heart Disease Risk Table                     Men   Women  1/2 Average Risk   3.4   3.3     ASSESSMENT:    1. SSS (sick sinus syndrome) (HCC)   2. PAT (paroxysmal atrial tachycardia) (HCC)   3. Paroxysmal atrial fibrillation (HCC)   4. Pacemaker   5. Coronary artery disease involving coronary bypass graft of native heart without angina pectoris   6.  History of aortic valve replacement with bioprosthetic valve   7. Echocardiogram shows left ventricular diastolic dysfunction   8. Asymptomatic stenosis of right carotid artery   9. Refusal of blood transfusions as patient is Jehovah's Witness      PLAN:  In order of problems listed above:  1. SSS:  Although his heart rate histograms are very blunted, he is very sedentary and walks slowly with a walker.  I do not think changing the rate response sensor will make a difference. 2. PAT: Episodes are very brief.  There is no evidence of atrial fibrillation on the most recent device downloads. 3. PAF: Episodes of atrial fibrillation are extremely infrequent. None have been recorded at least since July 2014. 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 Despite high estimated embolic risk (CHADSVasc 4 - age 58, CAD, HTN), he has never had a stroke or transient ischemic attack. He is very leery of full anticoagulation. We'll continue current antiplatelet regimen.  Report any falls promptly, especially if they involve head impact 4. PPM: Normal device function.  Remote downloads every 3 months and yearly office follow-up 5. CAD s/p CABG: No angina/asymptomatic. 6. S/p AVR: Normal prosthetic function by clinical exam. Normal function on echo in 2014.  Do not see a reason to repeat echo at this time. 7. Diastolic dysfunction by echo, without clinical heart failure.  Leg swelling is likely related to venous insufficiency, not heart failure 8. Right carotid stenosis: Moderate RICA stenosis, stable in 60-79% range for several consecutive tests.  The most recent ultrasound performed at another institution in [place] shows no significant obstruction. In the absence of clinical neurological events, continue with statin, clopidogrel. 9. Jehovah's Witness refusing blood products    Medication Adjustments/Labs and Tests Ordered: Current medicines are reviewed at length with the patient today.  Concerns  regarding medicines are outlined above.  Medication changes, Labs and Tests ordered today are listed in the Patient Instructions below. Patient Instructions  Dr Royann Shivers recommends that you schedule a follow-up appointment in 12 months. You will receive a reminder letter in the mail two months in advance. If you don't receive a letter, please call our office to schedule the follow-up appointment.  If you need a refill on your cardiac medications before your next appointment, please call your pharmacy.      Signed, Thurmon Fair, MD  02/16/2018 4:00 PM    [facility] [address] [place], [place], [place]  [address] Phone: [phone]; Fax: [phone]

## 2018-02-18 NOTE — Telephone Encounter (Signed)
Results reviewed with patient at office visit on 02/16/18.

## 2018-02-23 NOTE — Addendum Note (Signed)
Addended by: Chana BodeGREEN, Seanna Sisler L on: 02/23/2018 04:51 PM   Modules accepted: Orders

## 2018-05-06 ENCOUNTER — Ambulatory Visit (INDEPENDENT_AMBULATORY_CARE_PROVIDER_SITE_OTHER): Payer: Medicare Other | Admitting: *Deleted

## 2018-05-06 DIAGNOSIS — I495 Sick sinus syndrome: Secondary | ICD-10-CM | POA: Diagnosis not present

## 2018-05-06 DIAGNOSIS — I471 Supraventricular tachycardia: Secondary | ICD-10-CM

## 2018-05-07 LAB — CUP PACEART REMOTE DEVICE CHECK
Battery Impedance: 372 Ohm
Battery Remaining Longevity: 99 mo
Battery Voltage: 2.78 V
Date Time Interrogation Session: 20190515140926
Implantable Lead Implant Date: 20070730
Implantable Lead Location: 753859
Implantable Lead Model: 4092
Implantable Pulse Generator Implant Date: 20140306
Lead Channel Setting Pacing Amplitude: 1.5 V
Lead Channel Setting Sensing Sensitivity: 4 mV
MDC IDC LEAD IMPLANT DT: 20070730
MDC IDC LEAD LOCATION: 753860
MDC IDC MSMT LEADCHNL RA IMPEDANCE VALUE: 363 Ohm
MDC IDC MSMT LEADCHNL RA PACING THRESHOLD AMPLITUDE: 0.75 V
MDC IDC MSMT LEADCHNL RA PACING THRESHOLD PULSEWIDTH: 0.4 ms
MDC IDC MSMT LEADCHNL RV IMPEDANCE VALUE: 603 Ohm
MDC IDC MSMT LEADCHNL RV PACING THRESHOLD AMPLITUDE: 1 V
MDC IDC MSMT LEADCHNL RV PACING THRESHOLD PULSEWIDTH: 0.4 ms
MDC IDC MSMT LEADCHNL RV SENSING INTR AMPL: 8 mV
MDC IDC SET LEADCHNL RV PACING AMPLITUDE: 2.5 V
MDC IDC SET LEADCHNL RV PACING PULSEWIDTH: 0.4 ms
MDC IDC STAT BRADY AP VP PERCENT: 3 %
MDC IDC STAT BRADY AP VS PERCENT: 97 %
MDC IDC STAT BRADY AS VP PERCENT: 0 %
MDC IDC STAT BRADY AS VS PERCENT: 1 %

## 2018-05-07 NOTE — Progress Notes (Signed)
Remote pacemaker transmission.   

## 2018-05-08 ENCOUNTER — Encounter: Payer: Self-pay | Admitting: Cardiology

## 2018-08-06 ENCOUNTER — Ambulatory Visit (INDEPENDENT_AMBULATORY_CARE_PROVIDER_SITE_OTHER): Payer: Medicare Other | Admitting: *Deleted

## 2018-08-06 DIAGNOSIS — I495 Sick sinus syndrome: Secondary | ICD-10-CM | POA: Diagnosis not present

## 2018-08-06 NOTE — Progress Notes (Signed)
Remote pacemaker transmission.   

## 2018-09-06 ENCOUNTER — Telehealth: Payer: Self-pay | Admitting: Physician Assistant

## 2018-09-06 NOTE — Telephone Encounter (Signed)
Paged by answering service.  Patient having palpitation for past 3 days.  No syncope, dizziness, shortness of breath or chest pain.  Feeling okay.  His blood pressure in 150-170s/ 90-110's.  Heart rate in 110.  He takes metoprolol 25 mg twice daily.  He has pacemaker.  He just took his 25 mg of metoprolol 10 minutes prior to my evaluation.  Advised to take extra metoprolol 25 mg if heart rate above 100 in 30 minutes.  He will check his heart rate prior to his p.m. Dose, if above 100 bpm he will take 50 mg of metoprolol.  He will call the office in the morning for device check/office visit.  Likely he will need higher dose of metoprolol daily based on findings.

## 2018-09-07 ENCOUNTER — Telehealth: Payer: Self-pay | Admitting: Cardiovascular Disease

## 2018-09-07 NOTE — Telephone Encounter (Signed)
Returned call to patient's wife she stated husband has been having elevated pulse and B/P at present 155/98 pulse 109 irreg.B/P ranging 173/104,136/95,153/90,150/89.Pulse 112,112,116,112.Stated she called Dr on call yesterday and was told to take a extra 25 mg of metoprolol.Stated she will give him metoprolol 50 mg this morning.Advised I will send message to Dr.Croitoru for advice and send message to device clinic for a pacemaker check.

## 2018-09-07 NOTE — Telephone Encounter (Signed)
Transmission received.   AT/AF since 09/02/18 7:30pm, A rates 239-254bpm, V rates 108-114bpm.   Routed to Dr. Royann Shiversroitoru for review.

## 2018-09-07 NOTE — Telephone Encounter (Signed)
Returned call to patient's wife Dr.Croitoru's recommendation given.Metoprolol increased to 50 mg twice a day.Advised to keep appointment with Micah FlesherAngela Duke PA 09/10/18 at 2:30 pm to discuss changing Plavix to Eliquis.Dr.Michael Caplan's office called requested copy of last bmet to be faxed.

## 2018-09-07 NOTE — Telephone Encounter (Signed)
Please increase the metoprolol to 50 mg twice daily.  Please get a BMET if not done in the last 2-4 weeks? He is not on anticoagulation due to a tendency to fall frequently and personal preference due to implications of bleeding (he is a TEFL teacherJehovah's witness), but we need to discuss the risk of embolic stroke again and the superiority of medications such as Eliquis over his clopidogrel.  Has he had any recent falls? MCr

## 2018-09-07 NOTE — Telephone Encounter (Signed)
Called to request manual transmission- [name] reports that he just went into the bathroom and she will send it afterwards. She requested that I call back if I don't have the transmission by 10:15am.

## 2018-09-07 NOTE — Telephone Encounter (Signed)
New Message        Pt c/o BP issue: STAT if pt c/o blurred vision, one-sided weakness or slurred speech  1. What are your last 5 BP readings?      119/75 bp 112 pulse      147/89 bp 112 pulse       155/98      109 pulse- this one is from this AM  2. Are you having any other symptoms (ex. Dizziness, headache, blurred vision, passed out)?  No  3. What is your BP issue? Is up.    Patient was told to call Dr C if the pulse was over 100 and it is.

## 2018-09-09 NOTE — Progress Notes (Addendum)
Cardiology Office Note:    Date:  09/10/2018   ID:  [name], DOB 08-29-1924, MRN [id number]  PCP:  Romeo Rabon, MD  Cardiologist:  Thurmon Fair, MD   Referring MD: Romeo Rabon, MD   Chief Complaint  Patient presents with  . Hospitalization Follow-up    tachycardia    History of Present Illness:    [name] is a 84 y.o. male with a hx of CAD status post CABG x4 in 2004 (LIMA-LAD, SVG-diagonal, SVG-Cx, SVG-RCA) and AVR, PCI in 2007 with DES x 3 to RCA graft, PPM placed 2004 for sinus bradycardia/SSS (gen change 2014), HTN, HLD, (statin intolerant), carotid artery stenosis R>L (repeat in 2019 with mild plaque), and he is a jehovah's witness and refuses all blood products.  He last saw Dr. Royann Shivers on 02/16/2018 and was in an atrial paced ventricular sensed rhythm at that time.  He is atrial pacing.  He has paroxysmal atrial tachycardia and nonsustained VT, both asymptomatic.  Episodes of paroxysmal atrial fibrillation were noted to be extremely infrequent and he is not currently anticoagulated.  He is on antiplatelet therapy for his CAD.  Per phone notes, he called the on-call provider on 09/06/2018 with complaints of 3 days of palpitations and a heart rate in the 110s.  He takes Lopressor 25 mg twice daily and was instructed to take extra Lopressor and follow-up in the office.  He presents today for follow-up.  Per the patient and his wife, he experienced left-sided jaw pain, hypertension, and tachycardia Tuesday evening prompting them to call EMS.  EMS took them to [facility] for evaluation.  Piecing together sparse records that they were given and a conversation with the last attending who saw him, he had sinus tachycardia versus atrial fibrillation RVR.  The attending assured me that she saw P waves.  CT head was negative for acute changes, carotid duplex was negative for obstructive plaque, and CTA was negative for PE in the setting of a positive d-dimer.  Per  the [facility] attending, his troponin was negative and there were no ischemic changes on EKG.  They did not interrogate his pacemaker.  They increased his Lopressor to 75 mg with a drop in heart rate from 110-101 and resolution of his hypertension to a discharge pressure of 105/67. They discharged from the hospital and they came straight to this appt. He was told to follow up with his dentist for the jaw pain.  On arrival here, EKG appears to be sinus tachycardia with a rate of 112 bpm. He is largely asymptomatic. PPM interrogation reviewed with Dr. Alleigh Mollica Salvia, DOD, reveals what appears to be atrial flutter.  He has had no further jaw pain, he is not short of breath, and is not hypervolemic on exam.  He seems to be tolerating this paroxysmal atrial flutter versus atrial tachycardia well.  He denies further jaw pain.   Past Medical History:  Diagnosis Date  . Anemia, chronic disease   . Aortic stenosis 2004   tissue AVR  . CAD (coronary artery disease) 2004   CABG X 4  . History of nephrolithiasis   . HTN (hypertension)   . Pacemaker 2007, March 2013   MDT  . RBBB   . Refusal of blood transfusions as patient is Jehovah's Witness     Past Surgical History:  Procedure Laterality Date  . AORTIC VALVE REPLACEMENT  2004  . CARDIOVASCULAR STRESS TEST  04/30/2012   EF 49%, inferior lateral hypokinesis-akinesis no reversible ischemia,  large fixed inferior lateral scar  . CHOLECYSTECTOMY  1993  . CORONARY ANGIOPLASTY WITH STENT PLACEMENT  2007   Occluded DG-RCA ,Native RCA, overlapping DES stents  . CORONARY ARTERY BYPASS GRAFT  2004   LIMA-LAD; VT-diagonal; VG-circumflex; VG-RCA  . DENTAL RESTORATION/EXTRACTION WITH X-RAY  1965  . PACEMAKER INSERTION  2007  . PERMANENT PACEMAKER GENERATOR CHANGE  March 2013   MDT  . PERMANENT PACEMAKER GENERATOR CHANGE N/A 02/25/2013   Procedure: PERMANENT PACEMAKER GENERATOR CHANGE;  Surgeon: Thurmon Fair, MD;  Location: [facility] CATH LAB;  Service: Cardiovascular;   Laterality: N/A;  . TRANSTHORACIC ECHOCARDIOGRAM  09/08/13    EF 50-55% moderate hypokinesis of the inferior lateral myocardium, grade 1 diastolic dysfunction, aortic valve bioprosthetic with mild regurg valve area 1.7 mild mitral regurg     Current Medications: Current Meds  Medication Sig  . acetaminophen (TYLENOL) 650 MG CR tablet Take 650 mg by mouth 2 (two) times daily.  . clopidogrel (PLAVIX) 75 MG tablet Take 75 mg by mouth every morning.  . dorzolamide (TRUSOPT) 2 % ophthalmic solution Place 1 drop into both eyes 3 (three) times daily.  Marland Kitchen erythromycin ophthalmic ointment Place 1 application into both eyes daily as needed.   Marland Kitchen escitalopram (LEXAPRO) 10 MG tablet Take 1 tablet by mouth daily.  Marland Kitchen ezetimibe (ZETIA) 10 MG tablet Take 1 tablet (10 mg total) by mouth daily.  . fluorouracil (EFUDEX) 5 % cream Apply 1 application topically 2 (two) times daily. Applies to nose.  . hydrochlorothiazide (HYDRODIURIL) 25 MG tablet Take 12.5 mg by mouth every other day.   . loratadine (CLARITIN) 10 MG tablet Take 10 mg by mouth daily.  . metFORMIN (GLUCOPHAGE) 500 MG tablet Take 500 mg by mouth daily with breakfast.  . omeprazole (PRILOSEC) 20 MG capsule Take 20 mg by mouth every morning.  Bertram Gala Glycol-Propyl Glycol (SYSTANE PRESERVATIVE FREE OP) Place 1-2 drops into both eyes 3 (three) times daily.  Marland Kitchen senna (SENOKOT) 8.6 MG TABS tablet Take 1 tablet by mouth daily.  . [DISCONTINUED] Metoprolol Tartrate 75 MG TABS Take 50 mg by mouth 2 (two) times daily.      Allergies:   Sular [nisoldipine er]; Amlodipine; Bactrim [sulfamethoxazole-trimethoprim]; Celebrex [celecoxib]; Hytrin [terazosin]; Indocin [indomethacin]; Lopid [gemfibrozil]; Maxzide [hydrochlorothiazide w-triamterene]; Niacin and related; Norvasc [amlodipine besylate]; Penicillins; Procardia [nifedipine]; Tekturna [aliskiren]; Vasotec [enalapril maleate]; Vasotec [enalapril]; Amiodarone; Keflex [cephalexin]; Lescol [fluvastatin];  Nisoldipine; Pravastatin; Statins; and Zocor [simvastatin]   Social History   Socioeconomic History  . Marital status: Married    Spouse name: Not on file  . Number of children: Not on file  . Years of education: Not on file  . Highest education level: Not on file  Occupational History  . Occupation: retired    Comment: Personnel officer  Social Needs  . Financial resource strain: Not on file  . Food insecurity:    Worry: Not on file    Inability: Not on file  . Transportation needs:    Medical: Not on file    Non-medical: Not on file  Tobacco Use  . Smoking status: Former Smoker    Last attempt to quit: 10/21/1950    Years since quitting: 67.9  . Smokeless tobacco: Never Used  Substance and Sexual Activity  . Alcohol use: Not on file  . Drug use: Not on file  . Sexual activity: Not on file  Lifestyle  . Physical activity:    Days per week: Not on file    Minutes per session:  Not on file  . Stress: Not on file  Relationships  . Social connections:    Talks on phone: Not on file    Gets together: Not on file    Attends religious service: Not on file    Active member of club or organization: Not on file    Attends meetings of clubs or organizations: Not on file    Relationship status: Not on file  Other Topics Concern  . Not on file  Social History Narrative  . Not on file     Family History: The patient's family history includes Cancer in his mother; Heart disease in his brother, father, and mother; Pneumonia in his sister.  ROS:   Please see the history of present illness.    All other systems reviewed and are negative.  EKGs/Labs/Other Studies Reviewed:    The following studies were reviewed today:  Interrogation today Episodes of atrial flutter  EKG:  EKG is ordered today.  The ekg ordered today demonstrates sinus tachycardia  Recent Labs: No results found for requested labs within last 8760 hours.  Recent Lipid Panel    Component Value Date/Time    CHOL  04/09/2008 0458    152        ATP III CLASSIFICATION:  <200     mg/dL   Desirable  644-034  mg/dL   Borderline High  >=742    mg/dL   High   TRIG 80 59/56/3875 0458   HDL 27 (L) 04/09/2008 0458   CHOLHDL 5.6 04/09/2008 0458   VLDL 16 04/09/2008 0458   LDLCALC (H) 04/09/2008 0458    109        Total Cholesterol/HDL:CHD Risk Coronary Heart Disease Risk Table                     Men   Women  1/2 Average Risk   3.4   3.3    Physical Exam:    VS:  BP 124/78   Pulse (!) 112   Ht 5\' 3"  (1.6 m)   Wt 141 lb 6.4 oz (64.1 kg)   BMI 25.05 kg/m     Wt Readings from Last 3 Encounters:  09/10/18 141 lb 6.4 oz (64.1 kg)  02/16/18 142 lb 6.4 oz (64.6 kg)  02/05/17 139 lb (63 kg)     GEN: Well nourished, well developed in no acute distress HEENT: Normal NECK: No JVD; No carotid bruits LYMPHATICS: No lymphadenopathy CARDIAC: irregular rhythm, tachycardic rate RESPIRATORY:  Clear to auscultation without rales, wheezing or rhonchi  ABDOMEN: Soft, non-tender, non-distended MUSCULOSKELETAL:  No edema; No deformity  SKIN: Warm and dry NEUROLOGIC:  Alert and oriented x 3 PSYCHIATRIC:  Normal affect   ASSESSMENT:    1. Atrial flutter, unspecified type (HCC)   2. PAF (paroxysmal atrial fibrillation) (HCC)   3. Refusal of blood transfusions as patient is Jehovah's Witness   4. Coronary artery disease involving native coronary artery of native heart without angina pectoris   5. Diastolic dysfunction, grade 1 with EF 50-55% Sept 2014    PLAN:    In order of problems listed above:  Atrial flutter, unspecified type (HCC) - Plan: EKG 12-Lead PAF (paroxysmal atrial fibrillation) (HCC) Refusal of blood transfusions as patient is Jehovah's Witness Device interrogated and reviewed with Dr. Breckin Savannah Salvia, DOD.  It appears that he was having episodes of atrial flutter.  EKG appears to be atrial tachycardia today, but question atrial flutter.  I discussed stroke risk with the  patient and his  wife.  We discussed use of anticoagulation and bleeding risk including his refusal of blood products.  She states that they will accept erythropoietin.  After discussion with Dr. Daquavion Catala Salviaandolph, we increased his Lopressor to 100 mg twice daily and will refer him to the A. fib clinic next week (first available appt).  I would like for him to have close follow-up with Dr. Royann Shiversroitoru at his first available appt.  This patients CHA2DS2-VASc Score and unadjusted Ischemic Stroke Rate (% per year) is equal to 4.8 % stroke rate/year from a score of 4 (age, CHF, CAD).   Coronary artery disease involving native coronary artery of native heart without angina pectoris Stable, no chest pain.  However, his presenting symptoms to the ER was left jaw pain.  [facility]roponins at [facility] were negative. He denies further jaw pain today. Once rate/rhythm are controlled, may consider follow up stress test if he continues to have symptoms.    Diastolic dysfunction, grade 1 with EF 50-55% Sept 2014 He appears euvolemic on exam.   Medication Adjustments/Labs and Tests Ordered: Current medicines are reviewed at length with the patient today.  Concerns regarding medicines are outlined above.  Orders Placed This Encounter  Procedures  . EKG 12-Lead   Meds ordered this encounter  Medications  . metoprolol tartrate (LOPRESSOR) 100 MG tablet    Sig: Take 1 tablet (100 mg total) by mouth 2 (two) times daily.    Dispense:  30 tablet    Refill:  6    Signed, Marcelino Dusterngela Nicole Leeya Rusconi, [place]  09/10/2018 4:59 PM    [facility]

## 2018-09-10 ENCOUNTER — Encounter: Payer: Self-pay | Admitting: Physician Assistant

## 2018-09-10 ENCOUNTER — Ambulatory Visit (INDEPENDENT_AMBULATORY_CARE_PROVIDER_SITE_OTHER): Payer: Medicare Other | Admitting: Physician Assistant

## 2018-09-10 VITALS — BP 124/78 | HR 112 | Ht 63.0 in | Wt 141.4 lb

## 2018-09-10 DIAGNOSIS — I480 Paroxysmal atrial fibrillation: Secondary | ICD-10-CM

## 2018-09-10 DIAGNOSIS — I5189 Other ill-defined heart diseases: Secondary | ICD-10-CM

## 2018-09-10 DIAGNOSIS — I2510 Atherosclerotic heart disease of native coronary artery without angina pectoris: Secondary | ICD-10-CM | POA: Diagnosis not present

## 2018-09-10 DIAGNOSIS — I4892 Unspecified atrial flutter: Secondary | ICD-10-CM

## 2018-09-10 DIAGNOSIS — Z531 Procedure and treatment not carried out because of patient's decision for reasons of belief and group pressure: Secondary | ICD-10-CM

## 2018-09-10 MED ORDER — METOPROLOL TARTRATE 100 MG PO TABS: 100.0000 mg | ORAL_TABLET | Freq: Two times a day (BID) | ORAL | 6 refills | Status: DC

## 2018-09-10 NOTE — Progress Notes (Signed)
Thanks, Angie ?

## 2018-09-10 NOTE — Patient Instructions (Addendum)
Medication Instructions:  INCREASE Lopressor to 100mg  Take 1 tablet twice a day If you need a refill on your cardiac medications before your next appointment, please call your pharmacy.  Labwork: None  [facility] Located in our office  Testing/Procedures: None   Follow-Up: Your physician recommends that you schedule a follow-up appointment in: FIRST AVAILABLE WITH DR CROITORU ONLY  Any Other Special Instructions Will Be Listed Below (If Applicable).  YOU HAVE AN APPOINTMENT WITH THE [facility] CLINIC ON Wednesday September 16, 2018 AT 10AM [address] [place], [place] [id number] THE CLINIC IS ATTACHED TO THE HOSPITAL YOU WILL GO IN ON THE SIDE THAT SAYS [facility]. YOU TURN OFF [address] AND PULL INTO THE PARKING GARAGE, VEER TO THE RIGHT AND THE PASSWORD TO THE PARKING GATE IS 1600. IF YOU HAVE ANY QUESTIONS OR CONCERNS OR GET LOST CALL THE [facility] CLINIC AT [phone].    Atrial Fibrillation Atrial fibrillation is a type of heartbeat that is irregular or fast (rapid). If you have this condition, your heart keeps quivering in a weird (chaotic) way. This condition can make it so your heart cannot pump blood normally. Having this condition gives a person more risk for stroke, heart failure, and other heart problems. There are different types of atrial fibrillation. Talk with your doctor to learn about the type that you have. Follow these instructions at home:  Take over-the-counter and prescription medicines only as told by your doctor.  If your doctor prescribed a blood-thinning medicine, take it exactly as told. Taking too much of it can cause bleeding. If you do not take enough of it, you will not have the protection that you need against stroke and other problems.  Do not use any tobacco products. These include cigarettes, chewing tobacco, and e-cigarettes. If you need help quitting, ask your doctor.  If you have apnea (obstructive sleep apnea), manage it as told by  your doctor.  Do not drink alcohol.  Do not drink beverages that have caffeine. These include coffee, soda, and tea.  Maintain a healthy weight. Do not use diet pills unless your doctor says they are safe for you. Diet pills may make heart problems worse.  Follow diet instructions as told by your doctor.  Exercise regularly as told by your doctor.  Keep all follow-up visits as told by your doctor. This is important. Contact a doctor if:  You notice a change in the speed, rhythm, or strength of your heartbeat.  You are taking a blood-thinning medicine and you notice more bruising.  You get tired more easily when you move or exercise. Get help right away if:  You have pain in your chest or your belly (abdomen).  You have sweating or weakness.  You feel sick to your stomach (nauseous).  You notice blood in your throw up (vomit), poop (stool), or pee (urine).  You are short of breath.  You suddenly have swollen feet and ankles.  You feel dizzy.  Your suddenly get weak or numb in your face, arms, or legs, especially if it happens on one side of your body.  You have trouble talking, trouble understanding, or both.  Your face or your eyelid droops on one side. These symptoms may be an emergency. Do not wait to see if the symptoms will go away. Get medical help right away. Call your local emergency services (911 in the [place] Do not drive yourself to the hospital. This information is not intended to replace advice given  to you by your health care provider. Make sure you discuss any questions you have with your health care provider. Document Released: 09/17/2008 Document Revised: 05/16/2016 Document Reviewed: 04/05/2015 Elsevier Interactive Patient Education  [employer].

## 2018-09-11 ENCOUNTER — Telehealth: Payer: Self-pay | Admitting: Cardiovascular Disease

## 2018-09-11 NOTE — Telephone Encounter (Signed)
New Message:   Patient is requesting there discharge documents be mailed to her from [facility].   Patient stated the copies were made but never returned.

## 2018-09-11 NOTE — Telephone Encounter (Signed)
Received records from [facility] on 09/11/18, Appt 09/21/18 @ 1:40PM.[place]

## 2018-09-11 NOTE — Telephone Encounter (Signed)
[facility].

## 2018-09-14 LAB — CUP PACEART REMOTE DEVICE CHECK
Battery Impedance: 396 Ohm
Battery Voltage: 2.78 V
Brady Statistic AP VS Percent: 97 %
Brady Statistic AS VP Percent: 0 %
Date Time Interrogation Session: 20190815142434
Implantable Lead Implant Date: 20070730
Implantable Lead Implant Date: 20070730
Implantable Lead Location: 753860
Implantable Lead Model: 4092
Implantable Lead Model: 5076
Implantable Pulse Generator Implant Date: 20140306
Lead Channel Pacing Threshold Amplitude: 0.75 V
Lead Channel Pacing Threshold Pulse Width: 0.4 ms
Lead Channel Pacing Threshold Pulse Width: 0.4 ms
Lead Channel Setting Pacing Amplitude: 2.5 V
Lead Channel Setting Pacing Pulse Width: 0.4 ms
Lead Channel Setting Sensing Sensitivity: 4 mV
MDC IDC LEAD LOCATION: 753859
MDC IDC MSMT BATTERY REMAINING LONGEVITY: 96 mo
MDC IDC MSMT LEADCHNL RA IMPEDANCE VALUE: 354 Ohm
MDC IDC MSMT LEADCHNL RV IMPEDANCE VALUE: 615 Ohm
MDC IDC MSMT LEADCHNL RV PACING THRESHOLD AMPLITUDE: 1 V
MDC IDC SET LEADCHNL RA PACING AMPLITUDE: 1.5 V
MDC IDC STAT BRADY AP VP PERCENT: 2 %
MDC IDC STAT BRADY AS VS PERCENT: 1 %

## 2018-09-16 ENCOUNTER — Ambulatory Visit (HOSPITAL_COMMUNITY): Payer: Medicare Other | Admitting: Nurse Practitioner

## 2018-09-17 ENCOUNTER — Ambulatory Visit (HOSPITAL_COMMUNITY)
Admission: RE | Admit: 2018-09-17 | Discharge: 2018-09-17 | Disposition: A | Discharge status: Home / Self Care | Payer: Medicare Other | Source: Ambulatory Visit | Attending: Nurse Practitioner | Admitting: Nurse Practitioner

## 2018-09-17 VITALS — BP 112/74 | HR 112 | Ht 63.0 in | Wt 144.0 lb

## 2018-09-17 DIAGNOSIS — Z79899 Other long term (current) drug therapy: Secondary | ICD-10-CM | POA: Diagnosis not present

## 2018-09-17 DIAGNOSIS — I4510 Unspecified right bundle-branch block: Secondary | ICD-10-CM | POA: Insufficient documentation

## 2018-09-17 DIAGNOSIS — I10 Essential (primary) hypertension: Secondary | ICD-10-CM | POA: Diagnosis not present

## 2018-09-17 DIAGNOSIS — I4892 Unspecified atrial flutter: Secondary | ICD-10-CM | POA: Diagnosis not present

## 2018-09-17 DIAGNOSIS — Z950 Presence of cardiac pacemaker: Secondary | ICD-10-CM | POA: Insufficient documentation

## 2018-09-17 DIAGNOSIS — Z87442 Personal history of urinary calculi: Secondary | ICD-10-CM | POA: Diagnosis not present

## 2018-09-17 DIAGNOSIS — Z7984 Long term (current) use of oral hypoglycemic drugs: Secondary | ICD-10-CM | POA: Insufficient documentation

## 2018-09-17 DIAGNOSIS — Z952 Presence of prosthetic heart valve: Secondary | ICD-10-CM | POA: Diagnosis not present

## 2018-09-17 DIAGNOSIS — Z7902 Long term (current) use of antithrombotics/antiplatelets: Secondary | ICD-10-CM | POA: Insufficient documentation

## 2018-09-17 DIAGNOSIS — I6523 Occlusion and stenosis of bilateral carotid arteries: Secondary | ICD-10-CM | POA: Insufficient documentation

## 2018-09-17 DIAGNOSIS — Z7901 Long term (current) use of anticoagulants: Secondary | ICD-10-CM | POA: Insufficient documentation

## 2018-09-17 DIAGNOSIS — I2510 Atherosclerotic heart disease of native coronary artery without angina pectoris: Secondary | ICD-10-CM | POA: Insufficient documentation

## 2018-09-17 DIAGNOSIS — Z951 Presence of aortocoronary bypass graft: Secondary | ICD-10-CM | POA: Diagnosis not present

## 2018-09-17 DIAGNOSIS — Z87891 Personal history of nicotine dependence: Secondary | ICD-10-CM | POA: Diagnosis not present

## 2018-09-17 DIAGNOSIS — I4891 Unspecified atrial fibrillation: Secondary | ICD-10-CM | POA: Insufficient documentation

## 2018-09-17 DIAGNOSIS — Z955 Presence of coronary angioplasty implant and graft: Secondary | ICD-10-CM | POA: Diagnosis not present

## 2018-09-17 MED ORDER — APIXABAN 2.5 MG PO TABS: 2.5000 mg | ORAL_TABLET | Freq: Two times a day (BID) | ORAL | 0 refills | Status: DC

## 2018-09-17 NOTE — Progress Notes (Signed)
Primary Care Physician: Romeo Rabon, MD Referring Physician:Angela Duke Cardiologist: Dr. Corbin Ade [name] is a 84 y.o. male with a h/o  CAD status post CABG x4 in 2004 (LIMA-LAD, SVG-diagonal, SVG-Cx, SVG-RCA) and AVR, PCI in 2007 with DES x 3 to RCA graft, PPM placed 2004 for sinus bradycardia/SSS (gen change 2014), HTN, HLD, (statin intolerant), carotid artery stenosis R>L (repeat in 2019 with mild plaque) He is a Jehovah's witness and refuses all blood products.  He has been on Plavix for years for CAD.  He last saw Dr. Royann Shivers on 02/16/2018 and was in an atrial paced ventricular sensed rhythm at that time  He had an ER visit in [place], 9/20,  for jaw pain/palpitations and HR was found to be elevated there. He had lopressor increased and had f/u with Micah Flesher with Dr. Duke Salvia. Device was interrogated and pt was found to be in atrial  flutter and metoprolol was increased to 100 mg bid. He was referred to afib clinic for further management. He appears to still be in an atrial tach vrs atypical atrial flutter at 112 bpm. Increase of rate control does not seem to have any effect on v rates. His wife states he has been more fatigued for the last couple of weeks. No PND/orthopnea or exertional dyspnea  I discussed pt with Dr. Royann Shivers and pt may benefit from cardioversion but will need to be on anticoagulation. He is a TEFL teacher witness and discussion was had about this and the availability of an IV reversal agent for the DOAC's. He will need to stop plavix. Bleeding precautions discussed, does not use NSAID's, does have a fall history but none recently. He does use a walker. Labs reviewed from [facility] (as wife has copies with her and no current CBC or bmet in Epic). He is chronically anemic with H/H of 9.5/29.2, plts of  129. Bmet shows creatinine of 1.47 with normal NA, K+. BNP at 793. Discussed with PharmD and with his age over 15, creatinine hovering around 1.5, it is  thought to be best to start on eliquis at 2.5 mg bid. Dr. Salena Saner hopes to use 30 days prior and after cardioversion and then hopefully stop anticoagulation.  Today, he denies symptoms of palpitations, chest pain, shortness of breath, orthopnea, PND, lower extremity edema, dizziness, presyncope, syncope, or neurologic sequela.+ fatigue.The patient is tolerating medications without difficulties and is otherwise without complaint today.   Past Medical History:  Diagnosis Date  . Anemia, chronic disease   . Aortic stenosis 2004   tissue AVR  . CAD (coronary artery disease) 2004   CABG X 4  . History of nephrolithiasis   . HTN (hypertension)   . Pacemaker 2007, March 2013   MDT  . RBBB   . Refusal of blood transfusions as patient is Jehovah's Witness    Past Surgical History:  Procedure Laterality Date  . AORTIC VALVE REPLACEMENT  2004  . CARDIOVASCULAR STRESS TEST  04/30/2012   EF 49%, inferior lateral hypokinesis-akinesis no reversible ischemia, large fixed inferior lateral scar  . CHOLECYSTECTOMY  1993  . CORONARY ANGIOPLASTY WITH STENT PLACEMENT  2007   Occluded DG-RCA ,Native RCA, overlapping DES stents  . CORONARY ARTERY BYPASS GRAFT  2004   LIMA-LAD; VT-diagonal; VG-circumflex; VG-RCA  . DENTAL RESTORATION/EXTRACTION WITH X-RAY  1965  . PACEMAKER INSERTION  2007  . PERMANENT PACEMAKER GENERATOR CHANGE  March 2013   MDT  . PERMANENT PACEMAKER GENERATOR CHANGE N/A 02/25/2013  Procedure: PERMANENT PACEMAKER GENERATOR CHANGE;  Surgeon: Thurmon Fair, MD;  Location: [facility] CATH LAB;  Service: Cardiovascular;  Laterality: N/A;  . TRANSTHORACIC ECHOCARDIOGRAM  09/08/13    EF 50-55% moderate hypokinesis of the inferior lateral myocardium, grade 1 diastolic dysfunction, aortic valve bioprosthetic with mild regurg valve area 1.7 mild mitral regurg     Current Outpatient Medications  Medication Sig Dispense Refill  . acetaminophen (TYLENOL) 650 MG CR tablet Take 650 mg by mouth 2 (two) times  daily.    . dorzolamide (TRUSOPT) 2 % ophthalmic solution Place 1 drop into both eyes 3 (three) times daily.    Marland Kitchen erythromycin ophthalmic ointment Place 1 application into both eyes daily as needed.   3  . escitalopram (LEXAPRO) 10 MG tablet Take 5 mg by mouth daily.     Marland Kitchen ezetimibe (ZETIA) 10 MG tablet Take 1 tablet (10 mg total) by mouth daily. 90 tablet 3  . hydrochlorothiazide (HYDRODIURIL) 25 MG tablet Take 12.5 mg by mouth every other day.     . loratadine (CLARITIN) 10 MG tablet Take 10 mg by mouth daily.    . metFORMIN (GLUCOPHAGE) 500 MG tablet Take 500 mg by mouth daily with breakfast.    . metoprolol tartrate (LOPRESSOR) 100 MG tablet Take 1 tablet (100 mg total) by mouth 2 (two) times daily. 30 tablet 6  . Netarsudil Dimesylate (RHOPRESSA) 0.02 % SOLN Apply 1 drop to eye at bedtime. Right eye    . omeprazole (PRILOSEC) 20 MG capsule Take 20 mg by mouth every morning.    Bertram Gala Glycol-Propyl Glycol (SYSTANE PRESERVATIVE FREE OP) Place 1-2 drops into both eyes 3 (three) times daily.    Marland Kitchen senna (SENOKOT) 8.6 MG TABS tablet Take 1 tablet by mouth daily.    Marland Kitchen apixaban (ELIQUIS) 2.5 MG TABS tablet Take 1 tablet (2.5 mg total) by mouth 2 (two) times daily. 60 tablet 0   No current facility-administered medications for this encounter.     Allergies  Allergen Reactions  . Sular [Nisoldipine Er] Other (See Comments)    "stopped heartbeat"   . Amlodipine   . Bactrim [Sulfamethoxazole-Trimethoprim]   . Celebrex [Celecoxib]   . Hytrin [Terazosin]   . Indocin [Indomethacin]   . Lopid [Gemfibrozil]   . Maxzide [Hydrochlorothiazide W-Triamterene]   . Niacin And Related   . Norvasc [Amlodipine Besylate]   . Penicillins Swelling  . Procardia [Nifedipine]   . Tekturna [Aliskiren]   . Vasotec [Enalapril Maleate]   . Vasotec [Enalapril]   . Amiodarone Diarrhea and Nausea And Vomiting  . Keflex [Cephalexin] Nausea And Vomiting  . Lescol [Fluvastatin] Other (See Comments)    Leg  cramps  . Nisoldipine Other (See Comments)    Other Reaction: B/P drops  . Pravastatin Other (See Comments)    Leg cramps  . Statins Other (See Comments)    "leg pain"  . Zocor [Simvastatin]     Leg cramps    Social History   Socioeconomic History  . Marital status: Married    Spouse name: Not on file  . Number of children: Not on file  . Years of education: Not on file  . Highest education level: Not on file  Occupational History  . Occupation: retired    Comment: Personnel officer  Social Needs  . Financial resource strain: Not on file  . Food insecurity:    Worry: Not on file    Inability: Not on file  . Transportation needs:    Medical:  Not on file    Non-medical: Not on file  Tobacco Use  . Smoking status: Former Smoker    Last attempt to quit: 10/21/1950    Years since quitting: 67.9  . Smokeless tobacco: Never Used  Substance and Sexual Activity  . Alcohol use: Not on file  . Drug use: Not on file  . Sexual activity: Not on file  Lifestyle  . Physical activity:    Days per week: Not on file    Minutes per session: Not on file  . Stress: Not on file  Relationships  . Social connections:    Talks on phone: Not on file    Gets together: Not on file    Attends religious service: Not on file    Active member of club or organization: Not on file    Attends meetings of clubs or organizations: Not on file    Relationship status: Not on file  . Intimate partner violence:    Fear of current or ex partner: Not on file    Emotionally abused: Not on file    Physically abused: Not on file    Forced sexual activity: Not on file  Other Topics Concern  . Not on file  Social History Narrative  . Not on file    Family History  Problem Relation Age of Onset  . Cancer Mother   . Heart disease Mother   . Heart disease Father   . Pneumonia Sister   . Heart disease Brother     ROS- All systems are reviewed and negative except as per the HPI above  Physical  Exam: Vitals:   09/17/18 1335  BP: 112/74  Pulse: (!) 112  Weight: 65.3 kg  Height: 5\' 3"  (1.6 m)   Wt Readings from Last 3 Encounters:  09/17/18 65.3 kg  09/10/18 64.1 kg  02/16/18 64.6 kg    Labs: Lab Results  Component Value Date   NA 140 04/09/2008   K 4.1 04/09/2008   CL 103 04/09/2008   CO2 29 04/09/2008   GLUCOSE 107 (H) 04/09/2008   BUN 21 04/09/2008   CREATININE 1.08 04/09/2008   CALCIUM 8.7 04/09/2008   MG 1.8 04/09/2008   Lab Results  Component Value Date   INR 1.3 04/09/2008   Lab Results  Component Value Date   CHOL  04/09/2008    152        ATP III CLASSIFICATION:  <200     mg/dL   Desirable  161-096  mg/dL   Borderline High  >=045    mg/dL   High   HDL 27 (L) 40/98/1191   LDLCALC (H) 04/09/2008    109        Total Cholesterol/HDL:CHD Risk Coronary Heart Disease Risk Table                     Men   Women  1/2 Average Risk   3.4   3.3   TRIG 80 04/09/2008     GEN- The patient is well appearing, alert and oriented x 3 today.   Head- normocephalic, atraumatic Eyes-  Sclera clear, conjunctiva pink Ears- hearing intact Oropharynx- clear Neck- supple, no JVP Lymph- no cervical lymphadenopathy Lungs- Clear to ausculation bilaterally, normal work of breathing Heart- Regular rate and rhythm, no murmurs, rubs or gallops, PMI not laterally displaced GI- soft, NT, ND, + BS Extremities- no clubbing, cyanosis, or edema MS- no significant deformity or atrophy Skin- no rash or lesion Psych-  euthymic mood, full affect Neuro- strength and sensation are intact  EKG- atrial tach at 112 bpm, vrs atypical atrial flutter  Please see labs from  [facility] ER in HPI    Assessment and Plan: 1. Atach/Atrial flutter Pt appears to have been out of rhythm for a couple of weeks Symptomatic with fatigue Continue with  increase of metoprolol but does seem to have been effective in controlling v rates,on , pt is feeling fatigue, probably combination of being out  of rhythm and  increase of BB, would lower after cardioversion  2. CHA2DS2VASc score of at least 4 He seems to be tolerating  arrhythmia ok now but may need to have TEE guided cardioversion if he shows any signs of decompensation  Weight is stable Bleeding precautions discussed  Will stop plavix Start eliquis 2.5 mg bid (age, creatinine around 1.5), hopefully can cardiovert and after 30 days stop anticoagulation as increased risk of bleeding and Jehovah's witness Fall precautions discussed Avoid NSAID's CBC ordered on f/u with Dr. Salena Saner to see if any change in baseline CBC with start of eliquis  F/u with Dr. Salena Saner 9/30 as scheduled afib clinic as needed  Lupita Leash C. [facility] [address] [place], [place] [address] [phone]

## 2018-09-17 NOTE — Progress Notes (Signed)
Thank you, Donna! MCr 

## 2018-09-17 NOTE — Patient Instructions (Signed)
Stop plavix  Start Eliquis 2.5mg twice a day 

## 2018-09-18 ENCOUNTER — Telehealth: Payer: Self-pay | Admitting: Cardiovascular Disease

## 2018-09-18 NOTE — Telephone Encounter (Signed)
Spoke with patients wife and she wanted to make sure Eliquis ok to take with AVR. Advised ok to take with AVR. Also she is concerned with the recommendation of patient using an Neurosurgeon. Per wife he has tried in the past and his face does not tolerate but does cut himself at times shaving. Explained to wife risks vs benefits and unfortunately the medications to keep him protected have risks of bleeding. She thanked me for call back and will proceed Eliquis as recommended.

## 2018-09-18 NOTE — Telephone Encounter (Signed)
Thank you :)

## 2018-09-18 NOTE — Telephone Encounter (Signed)
Pt c/o medication issue:  1. Name of Medication: apixaban (ELIQUIS) 2.5 MG TABS tablet  2. How are you currently taking this medication (dosage and times per day)? Take 1 tablet (2.5 mg total) by mouth 2 (two) times daily.  3. Are you having a reaction (difficulty breathing--STAT)? No   4. What is your medication issue? Patient has a questions about the side effects of the medication

## 2018-09-21 ENCOUNTER — Encounter: Payer: Self-pay | Admitting: Cardiovascular Disease

## 2018-09-21 ENCOUNTER — Ambulatory Visit (INDEPENDENT_AMBULATORY_CARE_PROVIDER_SITE_OTHER): Payer: Medicare Other | Admitting: Cardiovascular Disease

## 2018-09-21 VITALS — BP 104/69 | HR 109 | Ht 63.0 in | Wt 143.0 lb

## 2018-09-21 DIAGNOSIS — I6521 Occlusion and stenosis of right carotid artery: Secondary | ICD-10-CM | POA: Diagnosis not present

## 2018-09-21 DIAGNOSIS — IMO0001 Reserved for inherently not codable concepts without codable children: Secondary | ICD-10-CM

## 2018-09-21 DIAGNOSIS — I484 Atypical atrial flutter: Secondary | ICD-10-CM

## 2018-09-21 DIAGNOSIS — Z7901 Long term (current) use of anticoagulants: Secondary | ICD-10-CM | POA: Diagnosis not present

## 2018-09-21 DIAGNOSIS — Z531 Procedure and treatment not carried out because of patient's decision for reasons of belief and group pressure: Secondary | ICD-10-CM

## 2018-09-21 DIAGNOSIS — I25118 Atherosclerotic heart disease of native coronary artery with other forms of angina pectoris: Secondary | ICD-10-CM | POA: Diagnosis not present

## 2018-09-21 DIAGNOSIS — I495 Sick sinus syndrome: Secondary | ICD-10-CM | POA: Diagnosis not present

## 2018-09-21 DIAGNOSIS — I472 Ventricular tachycardia: Secondary | ICD-10-CM

## 2018-09-21 DIAGNOSIS — I4729 Other ventricular tachycardia: Secondary | ICD-10-CM

## 2018-09-21 DIAGNOSIS — D649 Anemia, unspecified: Secondary | ICD-10-CM

## 2018-09-21 DIAGNOSIS — Z950 Presence of cardiac pacemaker: Secondary | ICD-10-CM

## 2018-09-21 DIAGNOSIS — Z952 Presence of prosthetic heart valve: Secondary | ICD-10-CM

## 2018-09-21 DIAGNOSIS — I5189 Other ill-defined heart diseases: Secondary | ICD-10-CM

## 2018-09-21 NOTE — H&P (View-Only) (Signed)
Patient ID: [name], male   DOB: 04/16/1924, 84 y.o.   MRN: [id number]    Cardiology Office Note    Date:  09/26/2018   ID:  [name], DOB 08/28/1924, MRN [id number]  PCP:  Caplan, Michael, MD  Cardiologist:   Leialoha Hanna, MD   Chief Complaint  Patient presents with  . Irregular Heart Beat    atrial flutter on pacemaker    History of Present Illness:  [name] is a 84 y.o. male with a long-standing history of coronary, valvular and arrhythmic cardiac problems. He returns for yearly clinical and pacemaker follow-up.  August pacemaker download showed his usual pattern of infrequent paroxysmal atrial tachycardia, self-terminated. On 9/20 he was seen in the [place] ED for palpitations and jaw pain and had regular tachycardia. Pacemaker check in our office showed sustained atrial flutter with 2:1 AV block and V rate 112 bpm. Increasing doses of beta blocker have not helped. He is very tired ever since arrhythmia onset.  Cardioversion has been contemplated, but he first needs to be anticoagulated. He is prone to falls, is a Jehovah's witness that refuses transfusions and sustained arrhythmia had not been recorded in years, so he has not been on chronic anticoagulation. Anticoagulants were started on 9/26 at his visit with Donna Carroll in the AFib clinic.  He presents today in the same atrial flutter with 2:1 AV block and V rate 110.  Just submitted a remote pacemaker download on February 13.  His current pacemaker generator (Medtronic Adapta) implanted in 2014 has another 8.0years of longevity. Both leads were placed in 2007 and have excellent parameters. He had virtually 1004% atrial pacing and only 3% ventricular pacing, before the onset of the current episode of flutter. His device has periodically recorded NSVT, sometimes lengthy (almost 30 beats), never symptomatic.  [facility] He is a [employer] veteran and a Jehovah's Witness.  He has a  long-standing history of multiple cardiac problems. In 2004 he underwent multivessel bypass surgery (LIMA to LAD, SVG to diagonal, SVG to circumflex, SVG to RCA) and received a 21 mm pericardal aortic valve prosthesis, by Dr. van Trigt. It is important to note that he is a Jehovah's Witness and insists on no blood product transfusions. He had postoperative atrial fibrillation. In 2007 he had right coronary artery graft disease that required stenting with 3 consecutive Cypher drug-eluting stents all 3.5 in diameter, cumulative length of 79 mm. The last echo evaluation of his prosthetic valve was in September of 2014. The peak and mean gradients were excellent at 13 and 8 mm Hg respectively. There was no significant aortic insufficiency. Left ventricular systolic function was borderline low at 50-55% with inferior and inferolateral hypokinesis. In 2004, he received a dual-chamber permanent pacemaker for sinus bradycardia. He received a new generator (Medtronic Adapta) in March of 2014.  He has treated HTN and hyperlipidemia on Zetia (statin intolerant due to myalgia). Carotid ultrasonography performed November 2017 showed no change from 2016 (60-79% right internal carotid stenosis, 1-39%left internal carotid artery stenosis). Repeat carotid ultrasound performed at another institution in 2019 shows bilateral mild plaque, less than 49% stenosis.  Past Medical History:  Diagnosis Date  . Anemia, chronic disease   . Aortic stenosis 2004   tissue AVR  . CAD (coronary artery disease) 2004   CABG X 4  . History of nephrolithiasis   . HTN (hypertension)   . Pacemaker 2007, March 2013   MDT  .   RBBB   . Refusal of blood transfusions as patient is Jehovah's Witness     Outpatient Medications Prior to Visit  Medication Sig Dispense Refill  . acetaminophen (TYLENOL) 650 MG CR tablet Take 650 mg by mouth 3 times/day as needed-between meals & bedtime.     . apixaban (ELIQUIS) 2.5 MG TABS tablet Take 1  tablet (2.5 mg total) by mouth 2 (two) times daily. 60 tablet 0  . dorzolamide (TRUSOPT) 2 % ophthalmic solution Place 1 drop into both eyes 3 (three) times daily.    . erythromycin ophthalmic ointment Place 1 application into both eyes daily as needed.   3  . escitalopram (LEXAPRO) 10 MG tablet Take 5 mg by mouth daily.     . ezetimibe (ZETIA) 10 MG tablet Take 1 tablet (10 mg total) by mouth daily. 90 tablet 3  . hydrochlorothiazide (HYDRODIURIL) 25 MG tablet Take 12.5 mg by mouth every other day.     . LATANOPROST OP Place 1 drop into the right eye at bedtime.    . loratadine (CLARITIN) 10 MG tablet Take 10 mg by mouth daily.    . metFORMIN (GLUCOPHAGE) 500 MG tablet Take 500 mg by mouth daily with breakfast.    . metoprolol tartrate (LOPRESSOR) 100 MG tablet Take 1 tablet (100 mg total) by mouth 2 (two) times daily. 30 tablet 6  . omeprazole (PRILOSEC) 20 MG capsule Take 20 mg by mouth every morning.    . senna (SENOKOT) 8.6 MG TABS tablet Take 1 tablet by mouth daily.    . Netarsudil Dimesylate (RHOPRESSA) 0.02 % SOLN Apply 1 drop to eye at bedtime. Right eye    . Polyethyl Glycol-Propyl Glycol (SYSTANE PRESERVATIVE FREE OP) Place 1-2 drops into both eyes 3 (three) times daily.     No facility-administered medications prior to visit.      Allergies:   Sular [nisoldipine er]; Amlodipine; Bactrim [sulfamethoxazole-trimethoprim]; Celebrex [celecoxib]; Hytrin [terazosin]; Indocin [indomethacin]; Lopid [gemfibrozil]; Maxzide [hydrochlorothiazide w-triamterene]; Niacin and related; Norvasc [amlodipine besylate]; Penicillins; Procardia [nifedipine]; Tekturna [aliskiren]; Vasotec [enalapril maleate]; Vasotec [enalapril]; Amiodarone; Keflex [cephalexin]; Lescol [fluvastatin]; Nisoldipine; Pravastatin; Statins; and Zocor [simvastatin]   Social History   Socioeconomic History  . Marital status: Married    Spouse name: Not on file  . Number of children: Not on file  . Years of education: Not on  file  . Highest education level: Not on file  Occupational History  . Occupation: retired    Comment: tv master tech  Social Needs  . Financial resource strain: Not on file  . Food insecurity:    Worry: Not on file    Inability: Not on file  . Transportation needs:    Medical: Not on file    Non-medical: Not on file  Tobacco Use  . Smoking status: Former Smoker    Last attempt to quit: 10/21/1950    Years since quitting: 67.9  . Smokeless tobacco: Never Used  Substance and Sexual Activity  . Alcohol use: Not on file  . Drug use: Not on file  . Sexual activity: Not on file  Lifestyle  . Physical activity:    Days per week: Not on file    Minutes per session: Not on file  . Stress: Not on file  Relationships  . Social connections:    Talks on phone: Not on file    Gets together: Not on file    Attends religious service: Not on file    Active member of club or organization:   Not on file    Attends meetings of clubs or organizations: Not on file    Relationship status: Not on file  Other Topics Concern  . Not on file  Social History Narrative  . Not on file     Family History:  The patient's family history includes Cancer in his mother; Heart disease in his brother, father, and mother; Pneumonia in his sister.   ROS:   Please see the history of present illness.    ROS All other systems reviewed and are negative.   PHYSICAL EXAM:   VS:  BP 104/69 (BP Location: Left Arm, Patient Position: Sitting, Cuff Size: Normal)   Pulse (!) 109   Ht 5' 3" (1.6 m)   Wt 143 lb (64.9 kg)   BMI 25.33 kg/m      General: Alert, oriented x3, no distress, lean, appears frail. Healthy L subclavian PPM site Head: no evidence of trauma, PERRL, EOMI, no exophtalmos or lid lag, no myxedema, no xanthelasma; normal ears, nose and oropharynx Neck: normal jugular venous pulsations and no hepatojugular reflux; brisk carotid pulses without delay and no carotid bruits Chest: clear to  auscultation, no signs of consolidation by percussion or palpation, normal fremitus, symmetrical and full respiratory excursions Cardiovascular: normal position and quality of the apical impulse, regular rhythm, normal first and second heart sounds, 2/6 systolic aortic ejection murmur, no diastolic murmurs, rubs or gallops Abdomen: no tenderness or distention, no masses by palpation, no abnormal pulsatility or arterial bruits, normal bowel sounds, no hepatosplenomegaly Extremities: no clubbing, cyanosis or edema; 2+ radial, ulnar and brachial pulses bilaterally; 2+ right femoral, posterior tibial and dorsalis pedis pulses; 2+ left femoral, posterior tibial and dorsalis pedis pulses; no subclavian or femoral bruits Neurological: grossly nonfocal Psych: Normal mood and affect   Wt Readings from Last 3 Encounters:  09/21/18 143 lb (64.9 kg)  09/17/18 144 lb (65.3 kg)  09/10/18 141 lb 6.4 oz (64.1 kg)      Studies/Labs Reviewed:   EKG:  9/26 tracing shows atrial flutter with 2:1 AV block and old inferior Q waves and RBBB  Recent Labs: No results found for requested labs within last 8760 hours.   Lipid Panel    Component Value Date/Time   CHOL  04/09/2008 0458    152        ATP III CLASSIFICATION:  <200     mg/dL   Desirable  200-239  mg/dL   Borderline High  >=240    mg/dL   High   TRIG 80 04/09/2008 0458   HDL 27 (L) 04/09/2008 0458   CHOLHDL 5.6 04/09/2008 0458   VLDL 16 04/09/2008 0458   LDLCALC (H) 04/09/2008 0458    109        Total Cholesterol/HDL:CHD Risk Coronary Heart Disease Risk Table                     Men   Women  1/2 Average Risk   3.4   3.3     ASSESSMENT:    1. Atypical atrial flutter (HCC)   2. SSS (sick sinus syndrome) (HCC)   3. NSVT (nonsustained ventricular tachycardia) (HCC)   4. Anticoagulated   5. Pacemaker- MDT '07, gen change March 2014   6. Coronary artery disease involving native coronary artery of native heart with other form of angina  pectoris (HCC)   7. Aortic valve prosthesis present- Tissue AVR '04   8. Diastolic dysfunction, grade 1 with EF 50-55% Sept   2014   9. Stenosis of right carotid artery   10. Anemia, unspecified type   11. Refusal of blood transfusions as patient is Jehovah's Witness      PLAN:  In order of problems listed above:  1. AFlutter:  Persistent, not rate controlled despite high dose beta blocker (additional meds precluded by BP). Will need 3 weeks of anticoagulation before we perform overdrive pacing (which I think will work), cardioversion or start antiarrhythmics. He wants to avoid TEE, since he does not feel that sick and can wait 3 weeks. The DCCV procedure has been fully reviewed with the patient and written informed consent has been obtained. Understands the need to continue anticoagulation for at least 30 days after conversion. If arrhythmia recurs early consider EP referral to discuss RF ablation versus lifelong amiodarone. True atrial fibrillation ha not been recorded since July 2014, and if it occurs should be easier to rate control. 2. SSS: He had 100% A pacing before the flutter began 3. NSVT: no recent episodes. Past episodes were occasionally quite lengthy. On beta blocker. 4. Anticoagulation:  He has chronic anemia and is a Jehovah's Witness. Despite high estimated embolic risk (CHADSVasc 4 - age 4, CAD, HTN), he has never had a stroke or transient ischemic attack. He remains very leery of full anticoagulation. Will not want to continue beyond the 1-month after DCCV.  He knows to report any falls promptly, especially if they involve head impact 5. PPM: Normal device function.  Remote downloads every 3 months and yearly office follow-up 6. CAD s/p CABG: until tachyarrhythmia onset, he had been asymptomatic for years. 7. S/p AVR: Normal prosthetic function by clinical exam. Normal function on echo in 2014.  Will try to see if we can arrange for an echo on the day that he comes in for DCCV,  once heart rate is slower. 8. Diastolic dysfunction by echo, without clinical heart failure.  He is very sedentary, which will cover up his symptoms. 9. Right carotid stenosis: Moderate RICA stenosis, stable in 60-79% range for several consecutive tests.  The most recent ultrasound performed at another institution in [place] shows no significant obstruction. In the absence of clinical neurological events, continue with statin. Clopidogrel was stopped when Eliquis was started. 10. Jehovah's Witness refusing blood products    Medication Adjustments/Labs and Tests Ordered: Current medicines are reviewed at length with the patient today.  Concerns regarding medicines are outlined above.  Medication changes, Labs and Tests ordered today are listed in the Patient Instructions below. Patient Instructions   [facility] [address] [place] [address] Dept: [phone] Loc: [phone]  [name]  09/21/2018  You are scheduled for a Cardioversion on Monday, October 21st, 2019 with Dr. Yashvi Jasinski.  Please arrive at the [facility] (Main Entrance A) at [facility]: [address] [place], [place] [address] at 12:00 pm.   DIET: Nothing to eat or drink after midnight except a sip of water with medications (see medication instructions below).  Medication Instructions: Continue your anticoagulant: Eliquis You will need to continue your anticoagulant after your procedure until you are told by your provider that it is safe to stop  Labs: Please have your lab work done the week prior to the procedure.   You must have a responsible person to drive you home and stay in the waiting area during your procedure. Failure to do so could result in cancellation.  Bring your   insurance cards.  *Special Note: Every effort is made to have your procedure done on time. Occasionally there are emergencies that occur  at the hospital that may cause delays. Please be patient if a delay does occur.        Signed, Merril Nagy, MD  09/26/2018 2:38 PM    [facility] [address], [place], [place]  [address] Phone: [phone]; Fax: [phone]    

## 2018-09-21 NOTE — Patient Instructions (Addendum)
  [facility] CARDIOVASCULAR DIVISION [facility] [address] [place] [address] Dept: [phone] Loc: [phone]  [name]  09/21/2018  You are scheduled for a Cardioversion on Monday, October 21st, 2019 with Dr. Royann Shivers.  Please arrive at the [facility] (Main Entrance A) at [facility]: [address] [place], [place] [address] at 12:00 pm.   DIET: Nothing to eat or drink after midnight except a sip of water with medications (see medication instructions below).  Medication Instructions: Continue your anticoagulant: Eliquis You will need to continue your anticoagulant after your procedure until you are told by your provider that it is safe to stop  Labs: Please have your lab work done the week prior to the procedure.   You must have a responsible person to drive you home and stay in the waiting area during your procedure. Failure to do so could result in cancellation.  Bring your insurance cards.  *Special Note: Every effort is made to have your procedure done on time. Occasionally there are emergencies that occur at the hospital that may cause delays. Please be patient if a delay does occur.

## 2018-09-21 NOTE — Progress Notes (Signed)
Patient ID: [name], male   DOB: 10-09-1924, 84 y.o.   MRN: [id number]    Cardiology Office Note    Date:  09/26/2018   ID:  [name], DOB May 15, 1924, MRN [id number]  PCP:  Romeo Rabon, MD  Cardiologist:   Thurmon Fair, MD   Chief Complaint  Patient presents with  . Irregular Heart Beat    atrial flutter on pacemaker    History of Present Illness:  [name] is a 84 y.o. male with a long-standing history of coronary, valvular and arrhythmic cardiac problems. He returns for yearly clinical and pacemaker follow-up.  August pacemaker download showed his usual pattern of infrequent paroxysmal atrial tachycardia, self-terminated. On 9/20 he was seen in the [facility] ED for palpitations and jaw pain and had regular tachycardia. Pacemaker check in our office showed sustained atrial flutter with 2:1 AV block and V rate 112 bpm. Increasing doses of beta blocker have not helped. He is very tired ever since arrhythmia onset.  Cardioversion has been contemplated, but he first needs to be anticoagulated. He is prone to falls, is a Jehovah's witness that refuses transfusions and sustained arrhythmia had not been recorded in years, so he has not been on chronic anticoagulation. Anticoagulants were started on 9/26 at his visit with [name] in the AFib clinic.  He presents today in the same atrial flutter with 2:1 AV block and V rate 110.  Just submitted a remote pacemaker download on February 13.  His current pacemaker generator (Medtronic Adapta) implanted in 2014 has another 8.0years of longevity. Both leads were placed in 2007 and have excellent parameters. He had virtually 1004% atrial pacing and only 3% ventricular pacing, before the onset of the current episode of flutter. His device has periodically recorded NSVT, sometimes lengthy (almost 30 beats), never symptomatic.  He sees Dr. Arlyce Dice at the [facility]. 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  He has a  long-standing history of multiple cardiac problems. In 2004 he underwent multivessel bypass surgery (LIMA to LAD, SVG to diagonal, SVG to circumflex, SVG to RCA) and received a 21 mm pericardal aortic valve prosthesis, by [name]. 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 He had postoperative atrial fibrillation. In 2007 he had right coronary artery graft disease that required stenting with 3 consecutive Cypher drug-eluting stents all 3.5 in diameter, cumulative length of 79 mm. The last echo evaluation of his prosthetic valve was in September of 2014. The peak and mean gradients were excellent at 13 and 8 mm Hg respectively. There was no significant aortic insufficiency. Left ventricular systolic function was borderline low at 50-55% with inferior and inferolateral hypokinesis. In 2004, he received a dual-chamber permanent pacemaker for sinus bradycardia. He received a new generator Radiation protection practitioner) in March of 2014.  He has treated HTN and hyperlipidemia on Zetia (statin intolerant due to myalgia). Carotid ultrasonography performed November 2017 showed no change from 2016 (60-79% right internal carotid stenosis, 1-39%left internal carotid artery stenosis). Repeat carotid ultrasound performed at another institution in 2019 shows bilateral mild plaque, less than 49% stenosis.  Past Medical History:  Diagnosis Date  . Anemia, chronic disease   . Aortic stenosis 2004   tissue AVR  . CAD (coronary artery disease) 2004   CABG X 4  . History of nephrolithiasis   . HTN (hypertension)   . Pacemaker 2007, March 2013   MDT  .  RBBB   . Refusal of blood transfusions as patient is Jehovah's Witness     Outpatient Medications Prior to Visit  Medication Sig Dispense Refill  . acetaminophen (TYLENOL) 650 MG CR tablet Take 650 mg by mouth 3 times/day as needed-between meals & bedtime.     Marland Kitchen apixaban (ELIQUIS) 2.5 MG TABS tablet Take 1  tablet (2.5 mg total) by mouth 2 (two) times daily. 60 tablet 0  . dorzolamide (TRUSOPT) 2 % ophthalmic solution Place 1 drop into both eyes 3 (three) times daily.    Marland Kitchen erythromycin ophthalmic ointment Place 1 application into both eyes daily as needed.   3  . escitalopram (LEXAPRO) 10 MG tablet Take 5 mg by mouth daily.     Marland Kitchen ezetimibe (ZETIA) 10 MG tablet Take 1 tablet (10 mg total) by mouth daily. 90 tablet 3  . hydrochlorothiazide (HYDRODIURIL) 25 MG tablet Take 12.5 mg by mouth every other day.     Marland Kitchen LATANOPROST OP Place 1 drop into the right eye at bedtime.    Marland Kitchen loratadine (CLARITIN) 10 MG tablet Take 10 mg by mouth daily.    . metFORMIN (GLUCOPHAGE) 500 MG tablet Take 500 mg by mouth daily with breakfast.    . metoprolol tartrate (LOPRESSOR) 100 MG tablet Take 1 tablet (100 mg total) by mouth 2 (two) times daily. 30 tablet 6  . omeprazole (PRILOSEC) 20 MG capsule Take 20 mg by mouth every morning.    . senna (SENOKOT) 8.6 MG TABS tablet Take 1 tablet by mouth daily.    Larry Iles Dimesylate (RHOPRESSA) 0.02 % SOLN Apply 1 drop to eye at bedtime. Right eye    . Polyethyl Glycol-Propyl Glycol (SYSTANE PRESERVATIVE FREE OP) Place 1-2 drops into both eyes 3 (three) times daily.     No facility-administered medications prior to visit.      Allergies:   Sular [nisoldipine er]; Amlodipine; Bactrim [sulfamethoxazole-trimethoprim]; Celebrex [celecoxib]; Hytrin [terazosin]; Indocin [indomethacin]; Lopid [gemfibrozil]; Maxzide [hydrochlorothiazide w-triamterene]; Niacin and related; Norvasc [amlodipine besylate]; Penicillins; Procardia [nifedipine]; Tekturna [aliskiren]; Vasotec [enalapril maleate]; Vasotec [enalapril]; Amiodarone; Keflex [cephalexin]; Lescol [fluvastatin]; Nisoldipine; Pravastatin; Statins; and Zocor [simvastatin]   Social History   Socioeconomic History  . Marital status: Married    Spouse name: Not on file  . Number of children: Not on file  . Years of education: Not on  file  . Highest education level: Not on file  Occupational History  . Occupation: retired    Comment: Personnel officer  Social Needs  . Financial resource strain: Not on file  . Food insecurity:    Worry: Not on file    Inability: Not on file  . Transportation needs:    Medical: Not on file    Non-medical: Not on file  Tobacco Use  . Smoking status: Former Smoker    Last attempt to quit: 10/21/1950    Years since quitting: 67.9  . Smokeless tobacco: Never Used  Substance and Sexual Activity  . Alcohol use: Not on file  . Drug use: Not on file  . Sexual activity: Not on file  Lifestyle  . Physical activity:    Days per week: Not on file    Minutes per session: Not on file  . Stress: Not on file  Relationships  . Social connections:    Talks on phone: Not on file    Gets together: Not on file    Attends religious service: Not on file    Active member of club or organization:  Not on file    Attends meetings of clubs or organizations: Not on file    Relationship status: Not on file  Other Topics Concern  . Not on file  Social History Narrative  . Not on file     Family History:  The patient's family history includes Cancer in his mother; Heart disease in his brother, father, and mother; Pneumonia in his sister.   ROS:   Please see the history of present illness.    ROS All other systems reviewed and are negative.   PHYSICAL EXAM:   VS:  BP 104/69 (BP Location: Left Arm, Patient Position: Sitting, Cuff Size: Normal)   Pulse (!) 109   Ht 5\' 3"  (1.6 m)   Wt 143 lb (64.9 kg)   BMI 25.33 kg/m      General: Alert, oriented x3, no distress, lean, appears frail. Healthy L subclavian PPM site Head: no evidence of trauma, PERRL, EOMI, no exophtalmos or lid lag, no myxedema, no xanthelasma; normal ears, nose and oropharynx Neck: normal jugular venous pulsations and no hepatojugular reflux; brisk carotid pulses without delay and no carotid bruits Chest: clear to  auscultation, no signs of consolidation by percussion or palpation, normal fremitus, symmetrical and full respiratory excursions Cardiovascular: normal position and quality of the apical impulse, regular rhythm, normal first and second heart sounds, 2/6 systolic aortic ejection murmur, no diastolic murmurs, rubs or gallops Abdomen: no tenderness or distention, no masses by palpation, no abnormal pulsatility or arterial bruits, normal bowel sounds, no hepatosplenomegaly Extremities: no clubbing, cyanosis or edema; 2+ radial, ulnar and brachial pulses bilaterally; 2+ right femoral, posterior tibial and dorsalis pedis pulses; 2+ left femoral, posterior tibial and dorsalis pedis pulses; no subclavian or femoral bruits Neurological: grossly nonfocal Psych: Normal mood and affect   Wt Readings from Last 3 Encounters:  09/21/18 143 lb (64.9 kg)  09/17/18 144 lb (65.3 kg)  09/10/18 141 lb 6.4 oz (64.1 kg)      Studies/Labs Reviewed:   EKG:  9/26 tracing shows atrial flutter with 2:1 AV block and old inferior Q waves and RBBB  Recent Labs: No results found for requested labs within last 8760 hours.   Lipid Panel    Component Value Date/Time   CHOL  04/09/2008 0458    152        ATP III CLASSIFICATION:  <200     mg/dL   Desirable  161-096  mg/dL   Borderline High  >=045    mg/dL   High   TRIG 80 40/98/1191 0458   HDL 27 (L) 04/09/2008 0458   CHOLHDL 5.6 04/09/2008 0458   VLDL 16 04/09/2008 0458   LDLCALC (H) 04/09/2008 0458    109        Total Cholesterol/HDL:CHD Risk Coronary Heart Disease Risk Table                     Men   Women  1/2 Average Risk   3.4   3.3     ASSESSMENT:    1. Atypical atrial flutter (HCC)   2. SSS (sick sinus syndrome) (HCC)   3. NSVT (nonsustained ventricular tachycardia) (HCC)   4. Anticoagulated   5. Pacemaker- MDT '07, gen change March 2014   6. Coronary artery disease involving native coronary artery of native heart with other form of angina  pectoris (HCC)   7. Aortic valve prosthesis present- Tissue AVR '04   8. Diastolic dysfunction, grade 1 with EF 50-55% Sept  2014   9. Stenosis of right carotid artery   10. Anemia, unspecified type   11. Refusal of blood transfusions as patient is Jehovah's Witness      PLAN:  In order of problems listed above:  1. AFlutter:  Persistent, not rate controlled despite high dose beta blocker (additional meds precluded by BP). Will need 3 weeks of anticoagulation before we perform overdrive pacing (which I think will work), cardioversion or start antiarrhythmics. He wants to avoid TEE, since he does not feel that sick and can wait 3 weeks. The DCCV procedure has been fully reviewed with the patient and written informed consent has been obtained. Understands the need to continue anticoagulation for at least 30 days after conversion. If arrhythmia recurs early consider EP referral to discuss RF ablation versus lifelong amiodarone. True atrial fibrillation ha not been recorded since July 2014, and if it occurs should be easier to rate control. 2. SSS: He had 100% A pacing before the flutter began 3. NSVT: no recent episodes. Past episodes were occasionally quite lengthy. On beta blocker. 4. Anticoagulation:  He has chronic anemia and is a Jehovah's Witness. Despite high estimated embolic risk (CHADSVasc 4 - age 28, CAD, HTN), he has never had a stroke or transient ischemic attack. He remains very leery of full anticoagulation. Will not want to continue beyond the 72-month after DCCV.  He knows to report any falls promptly, especially if they involve head impact 5. PPM: Normal device function.  Remote downloads every 3 months and yearly office follow-up 6. CAD s/p CABG: until tachyarrhythmia onset, he had been asymptomatic for years. 7. S/p AVR: Normal prosthetic function by clinical exam. Normal function on echo in 2014.  Will try to see if we can arrange for an echo on the day that he comes in for DCCV,  once heart rate is slower. 8. Diastolic dysfunction by echo, without clinical heart failure.  He is very sedentary, which will cover up his symptoms. 9. Right carotid stenosis: Moderate RICA stenosis, stable in 60-79% range for several consecutive tests.  The most recent ultrasound performed at another institution in [place] shows no significant obstruction. In the absence of clinical neurological events, continue with statin. Clopidogrel was stopped when Eliquis was started. 10. Jehovah's Witness refusing blood products    Medication Adjustments/Labs and Tests Ordered: Current medicines are reviewed at length with the patient today.  Concerns regarding medicines are outlined above.  Medication changes, Labs and Tests ordered today are listed in the Patient Instructions below. Patient Instructions   [facility] CARDIOVASCULAR DIVISION [facility] [address] [place] [place] [address] Dept: [phone] Loc: [phone]  ZANDON TALTON  09/21/2018  You are scheduled for a Cardioversion on Monday, October 21st, 2019 with Dr. Royann Shivers.  Please arrive at the [facility] (Main Entrance A) at [facility]: [address] [place], [place] [address] at 12:00 pm.   DIET: Nothing to eat or drink after midnight except a sip of water with medications (see medication instructions below).  Medication Instructions: Continue your anticoagulant: Eliquis You will need to continue your anticoagulant after your procedure until you are told by your provider that it is safe to stop  Labs: Please have your lab work done the week prior to the procedure.   You must have a responsible person to drive you home and stay in the waiting area during your procedure. Failure to do so could result in cancellation.  Bring your  insurance cards.  *Special Note: Every effort is made to have your procedure done on time. Occasionally there are emergencies that occur  at the hospital that may cause delays. Please be patient if a delay does occur.        Signed, Thurmon Fair, MD  09/26/2018 2:38 PM    [facility] [address] [place], [place], [place]  [address] Phone: [phone]; Fax: [phone]

## 2018-09-24 ENCOUNTER — Other Ambulatory Visit: Payer: Self-pay

## 2018-09-24 ENCOUNTER — Telehealth: Payer: Self-pay | Admitting: Cardiovascular Disease

## 2018-09-24 NOTE — Telephone Encounter (Signed)
Returned call to patient's wife.She stated husband fell in bathroom last night hit head against bath tub.Stated he does not remember falling.Stated he was taken to [facility] ED, [facility].Stated he had head,chest,back xrays all normal,no bleeding.Stated he did fracture 2 ribs.She wanted to ask Dr.Croitoru if ok to have cardioversion 10/21.Message sent to Dr.Croitoru.

## 2018-09-24 NOTE — Telephone Encounter (Signed)
Returned call to patient's wife.Dr.Croitoru's advised if husband did not stop eliquis keep cardioversion appointment as planned.Stated he did not stop eliquis.

## 2018-09-24 NOTE — Telephone Encounter (Signed)
Yes, if he did not need to stop the anticoagulant will stick to the plan.

## 2018-09-24 NOTE — Telephone Encounter (Signed)
New Message:    Patient wife called to report her husband fell last night and broke 2 ribs.

## 2018-09-25 ENCOUNTER — Other Ambulatory Visit: Payer: Self-pay

## 2018-09-25 DIAGNOSIS — I4892 Unspecified atrial flutter: Secondary | ICD-10-CM

## 2018-09-26 ENCOUNTER — Encounter: Payer: Self-pay | Admitting: Cardiovascular Disease

## 2018-09-26 DIAGNOSIS — Z7901 Long term (current) use of anticoagulants: Secondary | ICD-10-CM | POA: Insufficient documentation

## 2018-09-26 DIAGNOSIS — I484 Atypical atrial flutter: Secondary | ICD-10-CM | POA: Insufficient documentation

## 2018-09-26 DIAGNOSIS — I495 Sick sinus syndrome: Secondary | ICD-10-CM | POA: Insufficient documentation

## 2018-09-26 DIAGNOSIS — I472 Ventricular tachycardia: Secondary | ICD-10-CM | POA: Insufficient documentation

## 2018-09-26 DIAGNOSIS — I4729 Other ventricular tachycardia: Secondary | ICD-10-CM | POA: Insufficient documentation

## 2018-09-28 ENCOUNTER — Telehealth: Payer: Self-pay

## 2018-09-28 DIAGNOSIS — I484 Atypical atrial flutter: Secondary | ICD-10-CM

## 2018-09-28 NOTE — Telephone Encounter (Signed)
-----   Message from Thurmon Fair, MD sent at 09/26/2018  2:39 PM EDT ----- I would love for Mr. Pugh to get a 2D echo on the same day as his DCCV. Can you please see if we can arrange echo as an OP at [facility], after the DCCV? Thanks [employer]

## 2018-09-28 NOTE — Telephone Encounter (Signed)
Echo ordered per M[facility] recommendations.

## 2018-09-29 NOTE — Telephone Encounter (Signed)
Left message for patient to call and schedule ECHO prior to cardioversion--will need to be scheduled at 10:30am or after cardioversion at [facility]

## 2018-10-07 ENCOUNTER — Telehealth: Payer: Self-pay | Admitting: Cardiovascular Disease

## 2018-10-07 LAB — BASIC METABOLIC PANEL
BUN / CREAT RATIO: 17 (ref 10–24)
BUN: 24 mg/dL (ref 10–36)
CO2: 21 mmol/L (ref 20–29)
Calcium: 8.9 mg/dL (ref 8.6–10.2)
Chloride: 102 mmol/L (ref 96–106)
Creatinine, Ser: 1.42 mg/dL — ABNORMAL HIGH (ref 0.76–1.27)
GFR calc Af Amer: 49 mL/min/{1.73_m2} — ABNORMAL LOW (ref >59)
GFR, EST NON AFRICAN AMERICAN: 43 mL/min/{1.73_m2} — AB (ref >59)
Glucose: 108 mg/dL — ABNORMAL HIGH (ref 65–99)
POTASSIUM: 4.5 mmol/L (ref 3.5–5.2)
SODIUM: 140 mmol/L (ref 134–144)

## 2018-10-07 LAB — CBC
Hematocrit: 31.1 % — ABNORMAL LOW (ref 37.5–51.0)
Hemoglobin: 10.1 g/dL — ABNORMAL LOW (ref 13.0–17.7)
MCH: 30 pg (ref 26.6–33.0)
MCHC: 32.5 g/dL (ref 31.5–35.7)
MCV: 92 fL (ref 79–97)
Platelets: 161 10*3/uL (ref 150–450)
RBC: 3.37 x10E6/uL — AB (ref 4.14–5.80)
RDW: 12.5 % (ref 12.3–15.4)
WBC: 5.5 10*3/uL (ref 3.4–10.8)

## 2018-10-07 NOTE — Telephone Encounter (Signed)
If no fever, I think we are OK to go ahead. MCr

## 2018-10-07 NOTE — Telephone Encounter (Signed)
Returned call to patient's wife.She stated she wanted to ask Dr.Croitoru if ok for husband to have echo and cardioversion Mon 10/21.Stated he does not have a fever, just runny nose,sore throat.Stated sinus drainage makes him cough.Stated PCP prescribed flonase.Advised I will send message to Dr.Croitoru for advice.

## 2018-10-07 NOTE — Telephone Encounter (Signed)
New message   Per patients wife, patient has ablation on 10/12/2018. Patient is having symptoms of sore throat, coughing, stuffy and runny nose. Please call to discuss if patient should still have the surgery and ECHO.

## 2018-10-08 LAB — CUP PACEART INCLINIC DEVICE CHECK
Battery Impedance: 445 Ohm
Battery Voltage: 2.77 V
Brady Statistic AP VP Percent: 2 %
Brady Statistic AP VS Percent: 95 %
Brady Statistic AS VS Percent: 2 %
Date Time Interrogation Session: 20190919190213
Lead Channel Impedance Value: 359 Ohm
Lead Channel Impedance Value: 544 Ohm
Lead Channel Pacing Threshold Amplitude: 0.75 V
Lead Channel Pacing Threshold Pulse Width: 0.4 ms
Lead Channel Setting Pacing Amplitude: 1.5 V
Lead Channel Setting Pacing Amplitude: 2.5 V
Lead Channel Setting Sensing Sensitivity: 5.6 mV
MDC IDC LEAD IMPLANT DT: 20070730
MDC IDC LEAD IMPLANT DT: 20070730
MDC IDC LEAD LOCATION: 753859
MDC IDC LEAD LOCATION: 753860
MDC IDC MSMT BATTERY REMAINING LONGEVITY: 91 mo
MDC IDC MSMT LEADCHNL RV PACING THRESHOLD AMPLITUDE: 1 V
MDC IDC MSMT LEADCHNL RV PACING THRESHOLD PULSEWIDTH: 0.4 ms
MDC IDC PG IMPLANT DT: 20140306
MDC IDC SET LEADCHNL RV PACING PULSEWIDTH: 0.4 ms
MDC IDC STAT BRADY AS VP PERCENT: 0 %

## 2018-10-08 NOTE — Telephone Encounter (Signed)
Returned call to patient's wife no answer.Left Dr.Croitoru's recommendation on personal voice mail.

## 2018-10-12 ENCOUNTER — Ambulatory Visit (HOSPITAL_COMMUNITY)
Admission: RE | Admit: 2018-10-12 | Discharge: 2018-10-12 | Disposition: A | Discharge status: Home / Self Care | Payer: Medicare Other | Source: Ambulatory Visit | Attending: Cardiovascular Disease | Admitting: Cardiovascular Disease

## 2018-10-12 ENCOUNTER — Ambulatory Visit (HOSPITAL_BASED_OUTPATIENT_CLINIC_OR_DEPARTMENT_OTHER)
Admission: RE | Admit: 2018-10-12 | Discharge: 2018-10-12 | Disposition: A | Discharge status: Home / Self Care | Payer: Medicare Other | Source: Ambulatory Visit | Attending: Cardiovascular Disease | Admitting: Cardiovascular Disease

## 2018-10-12 ENCOUNTER — Ambulatory Visit (HOSPITAL_COMMUNITY): Payer: Medicare Other | Admitting: Certified Registered"

## 2018-10-12 ENCOUNTER — Encounter (HOSPITAL_COMMUNITY): Payer: Self-pay | Admitting: *Deleted

## 2018-10-12 ENCOUNTER — Other Ambulatory Visit (HOSPITAL_COMMUNITY): Payer: Medicare Other

## 2018-10-12 ENCOUNTER — Encounter (HOSPITAL_COMMUNITY)
Admission: RE | Disposition: A | Discharge status: Home / Self Care | Payer: Self-pay | Source: Ambulatory Visit | Attending: Cardiovascular Disease

## 2018-10-12 DIAGNOSIS — Z87891 Personal history of nicotine dependence: Secondary | ICD-10-CM | POA: Diagnosis not present

## 2018-10-12 DIAGNOSIS — I484 Atypical atrial flutter: Secondary | ICD-10-CM | POA: Insufficient documentation

## 2018-10-12 DIAGNOSIS — Z8249 Family history of ischemic heart disease and other diseases of the circulatory system: Secondary | ICD-10-CM | POA: Insufficient documentation

## 2018-10-12 DIAGNOSIS — Z7901 Long term (current) use of anticoagulants: Secondary | ICD-10-CM | POA: Insufficient documentation

## 2018-10-12 DIAGNOSIS — Z79899 Other long term (current) drug therapy: Secondary | ICD-10-CM | POA: Insufficient documentation

## 2018-10-12 DIAGNOSIS — Z886 Allergy status to analgesic agent status: Secondary | ICD-10-CM | POA: Diagnosis not present

## 2018-10-12 DIAGNOSIS — Z888 Allergy status to other drugs, medicaments and biological substances status: Secondary | ICD-10-CM | POA: Diagnosis not present

## 2018-10-12 DIAGNOSIS — I2510 Atherosclerotic heart disease of native coronary artery without angina pectoris: Secondary | ICD-10-CM | POA: Insufficient documentation

## 2018-10-12 DIAGNOSIS — I5030 Unspecified diastolic (congestive) heart failure: Secondary | ICD-10-CM | POA: Diagnosis not present

## 2018-10-12 DIAGNOSIS — I4892 Unspecified atrial flutter: Secondary | ICD-10-CM

## 2018-10-12 DIAGNOSIS — Z953 Presence of xenogenic heart valve: Secondary | ICD-10-CM | POA: Insufficient documentation

## 2018-10-12 DIAGNOSIS — Z880 Allergy status to penicillin: Secondary | ICD-10-CM | POA: Insufficient documentation

## 2018-10-12 DIAGNOSIS — I350 Nonrheumatic aortic (valve) stenosis: Secondary | ICD-10-CM | POA: Insufficient documentation

## 2018-10-12 DIAGNOSIS — I4510 Unspecified right bundle-branch block: Secondary | ICD-10-CM | POA: Insufficient documentation

## 2018-10-12 DIAGNOSIS — I081 Rheumatic disorders of both mitral and tricuspid valves: Secondary | ICD-10-CM

## 2018-10-12 DIAGNOSIS — I110 Hypertensive heart disease with heart failure: Secondary | ICD-10-CM | POA: Insufficient documentation

## 2018-10-12 DIAGNOSIS — E785 Hyperlipidemia, unspecified: Secondary | ICD-10-CM | POA: Insufficient documentation

## 2018-10-12 DIAGNOSIS — I6521 Occlusion and stenosis of right carotid artery: Secondary | ICD-10-CM | POA: Insufficient documentation

## 2018-10-12 DIAGNOSIS — Z7984 Long term (current) use of oral hypoglycemic drugs: Secondary | ICD-10-CM | POA: Insufficient documentation

## 2018-10-12 DIAGNOSIS — Z87442 Personal history of urinary calculi: Secondary | ICD-10-CM | POA: Insufficient documentation

## 2018-10-12 DIAGNOSIS — I471 Supraventricular tachycardia: Secondary | ICD-10-CM | POA: Diagnosis not present

## 2018-10-12 DIAGNOSIS — Z882 Allergy status to sulfonamides status: Secondary | ICD-10-CM | POA: Insufficient documentation

## 2018-10-12 DIAGNOSIS — Z950 Presence of cardiac pacemaker: Secondary | ICD-10-CM | POA: Insufficient documentation

## 2018-10-12 DIAGNOSIS — I4891 Unspecified atrial fibrillation: Secondary | ICD-10-CM | POA: Insufficient documentation

## 2018-10-12 DIAGNOSIS — I495 Sick sinus syndrome: Secondary | ICD-10-CM | POA: Diagnosis not present

## 2018-10-12 DIAGNOSIS — Z951 Presence of aortocoronary bypass graft: Secondary | ICD-10-CM | POA: Insufficient documentation

## 2018-10-12 DIAGNOSIS — Z881 Allergy status to other antibiotic agents status: Secondary | ICD-10-CM | POA: Diagnosis not present

## 2018-10-12 DIAGNOSIS — I119 Hypertensive heart disease without heart failure: Secondary | ICD-10-CM

## 2018-10-12 HISTORY — PX: CARDIOVERSION: SHX1299

## 2018-10-12 LAB — ECHOCARDIOGRAM COMPLETE

## 2018-10-12 LAB — GLUCOSE, CAPILLARY: Glucose-Capillary: 91 mg/dL (ref 70–99)

## 2018-10-12 SURGERY — CARDIOVERSION
Anesthesia: General

## 2018-10-12 MED ORDER — PROPOFOL 10 MG/ML IV BOLUS
INTRAVENOUS | Status: DC | PRN
  Administered 2018-10-12: 30 mg via INTRAVENOUS

## 2018-10-12 MED ORDER — HYDROCORTISONE 1 % EX CREA
1.0000 "application " | TOPICAL_CREAM | Freq: Three times a day (TID) | CUTANEOUS | Status: DC | PRN
  Filled 2018-10-12: qty 28

## 2018-10-12 MED ORDER — APIXABAN 2.5 MG PO TABS: 2.5000 mg | ORAL_TABLET | Freq: Two times a day (BID) | ORAL | 0 refills | Status: DC

## 2018-10-12 MED ORDER — SODIUM CHLORIDE 0.9 % IV SOLN
INTRAVENOUS | Status: DC
  Administered 2018-10-12: 14:00:00 via INTRAVENOUS

## 2018-10-12 NOTE — Discharge Instructions (Signed)
Electrical Cardioversion, Care After °This sheet gives you information about how to care for yourself after your procedure. Your health care provider may also give you more specific instructions. If you have problems or questions, contact your health care provider. °What can I expect after the procedure? °After the procedure, it is common to have: °· Some redness on the skin where the shocks were given. ° °Follow these instructions at home: °· Do not drive for 24 hours if you were given a medicine to help you relax (sedative). °· Take over-the-counter and prescription medicines only as told by your health care provider. °· Ask your health care provider how to check your pulse. Check it often. °· Rest for 48 hours after the procedure or as told by your health care provider. °· Avoid or limit your caffeine use as told by your health care provider. °Contact a health care provider if: °· You feel like your heart is beating too quickly or your pulse is not regular. °· You have a serious muscle cramp that does not go away. °Get help right away if: °· You have discomfort in your chest. °· You are dizzy or you feel faint. °· You have trouble breathing or you are short of breath. °· Your speech is slurred. °· You have trouble moving an arm or leg on one side of your body. °· Your fingers or toes turn cold or blue. °This information is not intended to replace advice given to you by your health care provider. Make sure you discuss any questions you have with your health care provider. °Document Released: 09/29/2013 Document Revised: 07/12/2016 Document Reviewed: 06/14/2016 °Elsevier Interactive Patient Education © 2018 [employer] ° °

## 2018-10-12 NOTE — Anesthesia Preprocedure Evaluation (Addendum)
Anesthesia Evaluation  Patient identified by MRN, date of birth, ID band Patient awake    Reviewed: Allergy & Precautions, NPO status , Patient's Chart, lab work & pertinent test results  Airway Mallampati: II  TM Distance: >3 FB Neck ROM: Limited    Dental  (+) Edentulous Upper, Edentulous Lower, Dental Advisory Given   Pulmonary former smoker,    breath sounds clear to auscultation       Cardiovascular hypertension, + CAD, + CABG and + DOE  + dysrhythmias + pacemaker  Rhythm:irregular Rate:Normal  S/p AVR   Neuro/Psych    GI/Hepatic   Endo/Other    Renal/GU      Musculoskeletal   Abdominal   Peds  Hematology  (+) REFUSES BLOOD PRODUCTS, JEHOVAH'S WITNESS  Anesthesia Other Findings   Reproductive/Obstetrics                            Anesthesia Physical Anesthesia Plan  ASA: III  Anesthesia Plan: General   Post-op Pain Management:    Induction: Intravenous  PONV Risk Score and Plan: 2 and Propofol infusion and Treatment may vary due to age or medical condition  Airway Management Planned: Mask  Additional Equipment:   Intra-op Plan:   Post-operative Plan:   Informed Consent: I have reviewed the patients History and Physical, chart, labs and discussed the procedure including the risks, benefits and alternatives for the proposed anesthesia with the patient or authorized representative who has indicated his/her understanding and acceptance.     Plan Discussed with: CRNA, Anesthesiologist and Surgeon  Anesthesia Plan Comments:         Anesthesia Quick Evaluation

## 2018-10-12 NOTE — Op Note (Signed)
Procedure: Electrical Cardioversion Indications:  Atrial Flutter  Procedure Details:  Consent: Risks of procedure as well as the alternatives and risks of each were explained to the (patient/caregiver).  Consent for procedure obtained.   Before cardioversion, overdrive atrial pacing was attempted unsuccessfully.  Time Out: Verified patient identification, verified procedure, site/side was marked, verified correct patient position, special equipment/implants available, medications/allergies/relevent history reviewed, required imaging and test results available.  Performed  Patient placed on cardiac monitor, pulse oximetry, supplemental oxygen as necessary.  Sedation given: Dr. Maple Hudson, 30 mg IV propofol Pacer pads placed anterior and posterior chest.  Cardioverted 1 time(s).  Cardioversion with synchronized biphasic 120J shock.  Evaluation: Findings: Post procedure EKG shows: A paced, V sensed rhythm with frequent PACs Complications: None Patient did tolerate procedure well.  Device check post cardioversion showed normal function. Lower rate limit changed to 70 bpm and atrial preference pacing programmed to max 90 bpm.  Time Spent Directly with the Patient:  30 minutes   Kaitelyn Jamison 10/12/2018, 1:58 PM

## 2018-10-12 NOTE — Progress Notes (Signed)
  Echocardiogram 2D Echocardiogram has been performed.  [name] 10/12/2018, 2:58 PM

## 2018-10-12 NOTE — Transfer of Care (Signed)
Immediate Anesthesia Transfer of Care Note  Patient: [name]  Procedure(s) Performed: CARDIOVERSION (N/A )  Patient Location: Endoscopy Unit  Anesthesia Type:General  Level of Consciousness: lethargic and responds to stimulation  Airway & Oxygen Therapy: Patient Spontanous Breathing  Post-op Assessment: Report given to RN  Post vital signs: Reviewed and stable  Last Vitals:  Vitals Value Taken Time  BP    Temp    Pulse    Resp    SpO2      Last Pain:  Vitals:   10/12/18 1330  TempSrc: Oral  PainSc: 6       Patients Stated Pain Goal: 3 (10/12/18 1330)  Complications: No apparent anesthesia complications

## 2018-10-12 NOTE — Interval H&P Note (Signed)
History and Physical Interval Note:  10/12/2018 11:55 AM  [name]  has presented today for surgery, with the diagnosis of atrial flutter  The various methods of treatment have been discussed with the patient and family. After consideration of risks, benefits and other options for treatment, the patient has consented to  Procedure(s): CARDIOVERSION (N/A) as a surgical intervention .  The patient's history has been reviewed, patient examined, no change in status, stable for surgery.  I have reviewed the patient's chart and labs.  Questions were answered to the patient's satisfaction.     [name]

## 2018-10-14 ENCOUNTER — Encounter (HOSPITAL_COMMUNITY): Payer: Self-pay | Admitting: Cardiovascular Disease

## 2018-10-14 NOTE — Anesthesia Postprocedure Evaluation (Signed)
Anesthesia Post Note  Patient: [name]  Procedure(s) Performed: CARDIOVERSION (N/A )     Patient location during evaluation: Endoscopy Anesthesia Type: General Level of consciousness: awake and alert Pain management: pain level controlled Vital Signs Assessment: post-procedure vital signs reviewed and stable Respiratory status: spontaneous breathing, nonlabored ventilation, respiratory function stable and patient connected to nasal cannula oxygen Cardiovascular status: blood pressure returned to baseline and stable Postop Assessment: no apparent nausea or vomiting Anesthetic complications: no    Last Vitals:  Vitals:   10/12/18 1440 10/12/18 1450  BP: 106/80 131/86  Pulse: 74 71  Resp: 19 15  Temp:    SpO2: 98% 99%    Last Pain:  Vitals:   10/13/18 1439  TempSrc:   PainSc: 0-No pain                 Ghazi Rumpf

## 2018-10-21 ENCOUNTER — Telehealth: Payer: Self-pay

## 2018-10-21 DIAGNOSIS — Z79899 Other long term (current) drug therapy: Secondary | ICD-10-CM

## 2018-10-21 MED ORDER — SPIRONOLACTONE 25 MG PO TABS: 12.5000 mg | ORAL_TABLET | Freq: Every day | ORAL | 3 refills | Status: DC

## 2018-10-21 NOTE — Telephone Encounter (Signed)
Reviewed recommendations with patient's wife, Liborio Nixon.  Wife verbalized understanding and agreed with plan. Rx(s) sent to pharmacy electronically. Lab ordered and mailed to patient to have completed in 2 weeks.   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   Liborio Nixon inquired as to whether any changes would be made to his Metoprolol dose? I advised to her that the only medication change at this time was the addition of the spironolactone. There were no medication changes made at the time of DCCV and none made at [facility] prior to DCCV. I advised her that he'd probably stay on his current medication regimen but that I'd still run her inquiry by Dr C. Wife voiced appreciation.

## 2018-10-21 NOTE — Telephone Encounter (Signed)
That is correct.  Remind him that plan is to stay on anticoagulants for at least 30 days following the cardioversion.

## 2018-10-21 NOTE — Telephone Encounter (Signed)
-----   Message from Thurmon Fair, MD sent at 10/15/2018  8:07 AM EDT ----- His BP was rather high when he was in for the DCCV also, but I thought he was just nervous. Please add spironolactone 12.5 mg daily (he was on 25 mg daily a few years ago). Will take a couple of weeks for the full effect. Please check BMEt in about 2 weeks.

## 2018-11-05 ENCOUNTER — Encounter: Payer: Self-pay | Admitting: Cardiology

## 2018-11-05 ENCOUNTER — Ambulatory Visit (INDEPENDENT_AMBULATORY_CARE_PROVIDER_SITE_OTHER): Payer: Medicare Other | Admitting: *Deleted

## 2018-11-05 DIAGNOSIS — I495 Sick sinus syndrome: Secondary | ICD-10-CM

## 2018-11-05 NOTE — Progress Notes (Signed)
Remote pacemaker transmission.   

## 2018-11-10 LAB — BASIC METABOLIC PANEL
BUN / CREAT RATIO: 19 (ref 10–24)
BUN: 24 mg/dL (ref 10–36)
CO2: 23 mmol/L (ref 20–29)
CREATININE: 1.26 mg/dL (ref 0.76–1.27)
Calcium: 9.4 mg/dL (ref 8.6–10.2)
Chloride: 102 mmol/L (ref 96–106)
GFR calc non Af Amer: 49 mL/min/{1.73_m2} — ABNORMAL LOW (ref >59)
GFR, EST AFRICAN AMERICAN: 57 mL/min/{1.73_m2} — AB (ref >59)
Glucose: 96 mg/dL (ref 65–99)
Potassium: 4.1 mmol/L (ref 3.5–5.2)
SODIUM: 138 mmol/L (ref 134–144)

## 2018-11-13 ENCOUNTER — Telehealth: Payer: Self-pay | Admitting: Cardiovascular Disease

## 2018-11-13 DIAGNOSIS — I484 Atypical atrial flutter: Secondary | ICD-10-CM

## 2018-11-13 DIAGNOSIS — I25118 Atherosclerotic heart disease of native coronary artery with other forms of angina pectoris: Secondary | ICD-10-CM

## 2018-11-13 MED ORDER — APIXABAN 2.5 MG PO TABS: 2.5000 mg | ORAL_TABLET | Freq: Two times a day (BID) | ORAL | 6 refills | Status: DC

## 2018-11-13 MED ORDER — DIGOXIN 125 MCG PO TABS: 0.1250 mg | ORAL_TABLET | Freq: Every day | ORAL | 6 refills | Status: DC

## 2018-11-13 NOTE — Telephone Encounter (Signed)
Returned call to patient's wife Dr.Croitoru's recommendation given.Advised I will send message to device clinic.

## 2018-11-13 NOTE — Telephone Encounter (Signed)
Please ask him to do a pacemaker [employer] download to see if he has recurrent atrial fibrillation. Is he feeling any worse since the heart rate is faster? MCr

## 2018-11-13 NOTE — Telephone Encounter (Signed)
Back in atrial fibrillation. I would like him to start amiodarone 400 mg daily for a week, then 200 mg daily. MCr

## 2018-11-13 NOTE — Telephone Encounter (Signed)
Returned call to patient's wife no answer.LMTC. 

## 2018-11-13 NOTE — Telephone Encounter (Signed)
Received call from patient's wife.Dr.Croitoru's recommendation given.Patient will start taking Digoxin 0.125 mg daily.PM download in 3 weeks.Bmet and digoxin level in 3 weeks.Lab orders mailed.Advised to call back if continues to have fast heart beat.

## 2018-11-13 NOTE — Telephone Encounter (Signed)
Returned call to patient's wife she stated husband is allergic to Amiodarone.Stated caused nausea and vomiting.Message sent to Dr.Croitoru for advice.

## 2018-11-13 NOTE — Telephone Encounter (Signed)
Remote transmission reviewed. Presenting rhythm: AF/Vs 86-181 bpm. AF since 11/11/18. (2) HVR episodes, max dur. 4sec, last 10/30/18. Stable lead measurements. Normal device function.

## 2018-11-13 NOTE — Telephone Encounter (Signed)
Really not a lot of options.  The nausea is a dose-dependent side effect. Amiodarone can cause N/V when using higher "loading" doses, but may be well tolerated at lower "maintenance" doses. Only other drug option is digoxin. Please start digoxin 0.125 mg daily. Pacemaker download in 3 weeks please. Also BMET and dig level in 3 weeks. Thanks [employer]

## 2018-11-13 NOTE — Telephone Encounter (Signed)
New Message          STAT if HR is under 50 or over 120 (normal HR is 60-100 beats per minute)  1) What is your heart rate? 120/75 W/O MEDS  2) Do you have a log of your heart rate readings (document readings)?               154/94 LAST NIGHT  3) Do you have any other symptoms? Heart was racing

## 2018-11-13 NOTE — Telephone Encounter (Signed)
Returned call to patient's wife.She stated Dr.Croitoru wanted to know if husband's heart rate stayed above 100.Stated he had a cardioversion 11/12/18.Stated last night heart rate 4 times 112,109.This morning 106.Stated husband feels like heart heart beating fast.He is not having any symptoms,no dizziness.Advised I will send message to Dr.Croitoru for advice.

## 2018-11-13 NOTE — Telephone Encounter (Signed)
Remote scheduled for 12/04/18.

## 2018-11-30 ENCOUNTER — Telehealth: Payer: Self-pay | Admitting: Cardiovascular Disease

## 2018-11-30 NOTE — Telephone Encounter (Signed)
Patient's wife Liborio NixonJanice called stating she can get the medication cheaper through the [facility] she need the prescription sent to the [facility] and reason why the patient is on the medication.    apixaban (ELIQUIS) 2.5 MG TABS tablet digoxin (LANOXIN) 0.125 MG tablet    [facility], fax # [phone]

## 2018-11-30 NOTE — Telephone Encounter (Signed)
Will forward to dr croitoru's medical assistant to print script and have signed to fax

## 2018-12-03 ENCOUNTER — Telehealth: Payer: Self-pay | Admitting: Cardiovascular Disease

## 2018-12-03 MED ORDER — APIXABAN 2.5 MG PO TABS: 2.5000 mg | ORAL_TABLET | Freq: Two times a day (BID) | ORAL | 11 refills | Status: AC

## 2018-12-03 MED ORDER — DIGOXIN 125 MCG PO TABS: 0.1250 mg | ORAL_TABLET | Freq: Every day | ORAL | 11 refills | Status: DC

## 2018-12-03 NOTE — Telephone Encounter (Signed)
 *  STAT* If patient is at the pharmacy, call can be transferred to refill team.   1. Which medications need to be refilled? (please list name of each medication and dose if known) apixaban (ELIQUIS) 2.5 MG TABS tablet  digoxin (LANOXIN) 0.125 MG tablet  2. Which pharmacy/location (including street and city if local pharmacy) is medication to be sent to? [facility] Att[facility] Dr Christain SacramentoBeaver fax # [phone]  3. Do they need a 30 day or 90 day supply? 30 days  [facility] scripts with [facility] so [facility] can get meds through them due to cost

## 2018-12-04 ENCOUNTER — Ambulatory Visit (INDEPENDENT_AMBULATORY_CARE_PROVIDER_SITE_OTHER): Payer: Medicare Other

## 2018-12-04 ENCOUNTER — Telehealth: Payer: Self-pay | Admitting: Cardiology

## 2018-12-04 DIAGNOSIS — I495 Sick sinus syndrome: Secondary | ICD-10-CM

## 2018-12-04 NOTE — Telephone Encounter (Signed)
Confirmed remote transmission w/ pt wife.   

## 2018-12-07 NOTE — Progress Notes (Signed)
Remote pacemaker transmission.   

## 2018-12-08 ENCOUNTER — Telehealth: Payer: Self-pay

## 2018-12-08 ENCOUNTER — Encounter: Payer: Self-pay | Admitting: Cardiology

## 2018-12-08 DIAGNOSIS — Z79899 Other long term (current) drug therapy: Secondary | ICD-10-CM

## 2018-12-08 DIAGNOSIS — R7889 Finding of other specified substances, not normally found in blood: Secondary | ICD-10-CM

## 2018-12-08 LAB — BASIC METABOLIC PANEL
BUN / CREAT RATIO: 19 (ref 10–24)
BUN: 24 mg/dL (ref 10–36)
CO2: 22 mmol/L (ref 20–29)
CREATININE: 1.28 mg/dL — AB (ref 0.76–1.27)
Calcium: 9 mg/dL (ref 8.6–10.2)
Chloride: 104 mmol/L (ref 96–106)
GFR calc non Af Amer: 48 mL/min/{1.73_m2} — ABNORMAL LOW (ref >59)
GFR, EST AFRICAN AMERICAN: 56 mL/min/{1.73_m2} — AB (ref >59)
GLUCOSE: 96 mg/dL (ref 65–99)
Potassium: 4.3 mmol/L (ref 3.5–5.2)
SODIUM: 139 mmol/L (ref 134–144)

## 2018-12-08 LAB — DIGOXIN LEVEL: Digoxin, Serum: 1.8 ng/mL — ABNORMAL HIGH (ref 0.5–0.9)

## 2018-12-08 NOTE — Telephone Encounter (Signed)
Results reviewed with patient's wife, Liborio NixonJanice (okay per DPR). Wife verbalized understanding and agreed with plan. Repeat lab ordered and mailed to patient.

## 2018-12-08 NOTE — Telephone Encounter (Signed)
-----   Message from Thurmon FairMihai Croitoru, MD sent at 12/08/2018 11:18 AM EST ----- Please cut back the dose of digoxin to only 3 days a week: for example Mon, Wed, Fri - the level is too high. The other labs are OK. Recheck dig level in 14 days.

## 2018-12-08 NOTE — Telephone Encounter (Signed)
-----   Message from Mihai Croitoru, MD sent at 12/08/2018 11:18 AM EST ----- Please cut back the dose of digoxin to only 3 days a week: for example Mon, Wed, Fri - the level is too high. The other labs are OK. Recheck dig level in 14 days. 

## 2018-12-22 ENCOUNTER — Telehealth: Payer: Self-pay | Admitting: Cardiovascular Disease

## 2018-12-22 NOTE — Telephone Encounter (Signed)
New message      *STAT* If patient is at the pharmacy, call can be transferred to refill team.   1. Which medications need to be refilled? (please list name of each medication and dose if known) metoprolol tartrate (LOPRESSOR) 100 MG tablet     2. Which pharmacy/location (including street and city if local pharmacy) is medication to be sent to?[facility]   3. Do they need a 30 day or 90 day supply? 90 day  pt wants this called in on 12/24/18 because pt has new insurance

## 2018-12-24 MED ORDER — METOPROLOL TARTRATE 100 MG PO TABS: 100.0000 mg | ORAL_TABLET | Freq: Two times a day (BID) | ORAL | 1 refills | Status: DC

## 2018-12-24 NOTE — Telephone Encounter (Signed)
RX SENT TO REQUESTED PHARMACY PT WIFE NOTIFIED

## 2019-01-01 ENCOUNTER — Other Ambulatory Visit: Payer: Self-pay | Admitting: Cardiovascular Disease

## 2019-01-01 ENCOUNTER — Telehealth: Payer: Self-pay | Admitting: Cardiovascular Disease

## 2019-01-01 NOTE — Telephone Encounter (Signed)
OK. Labs not back yet

## 2019-01-01 NOTE — Telephone Encounter (Signed)
New Message:     Wife wants to know when is pt supposed to have lhis lab work?

## 2019-01-01 NOTE — Telephone Encounter (Signed)
Returned call to patient's wife no answer.LMTC. 

## 2019-01-01 NOTE — Telephone Encounter (Signed)
Received call back from patient's wife she stated husband had repeat digoxin level done this morning.Stated she wanted Dr.Croitoru to know she just found out this week her B/P monitor has been in accurate and she is going to purchase a new one when she is able.Stated she has been sick.Stated all of her husband's previous readings sent to Dr.Croitoru were probably in accurate.Advised I will make him aware.

## 2019-01-02 LAB — DIGOXIN LEVEL: Digoxin, Serum: 1.4 ng/mL — ABNORMAL HIGH (ref 0.5–0.9)

## 2019-01-04 LAB — CUP PACEART REMOTE DEVICE CHECK
Battery Impedance: 421 Ohm
Battery Remaining Longevity: 91 mo
Battery Voltage: 2.78 V
Brady Statistic AP VP Percent: 1 %
Brady Statistic AP VS Percent: 98 %
Brady Statistic AS VP Percent: 0 %
Date Time Interrogation Session: 20191114163057
Implantable Lead Implant Date: 20070730
Implantable Lead Implant Date: 20070730
Implantable Lead Location: 753859
Implantable Lead Location: 753860
Implantable Lead Model: 4092
Implantable Pulse Generator Implant Date: 20140306
Lead Channel Impedance Value: 359 Ohm
Lead Channel Impedance Value: 584 Ohm
Lead Channel Pacing Threshold Amplitude: 0.875 V
Lead Channel Pacing Threshold Amplitude: 1 V
Lead Channel Pacing Threshold Pulse Width: 0.4 ms
Lead Channel Pacing Threshold Pulse Width: 0.4 ms
Lead Channel Setting Pacing Amplitude: 1.75 V
Lead Channel Setting Pacing Amplitude: 2.5 V
Lead Channel Setting Pacing Pulse Width: 0.4 ms
Lead Channel Setting Sensing Sensitivity: 4 mV
MDC IDC STAT BRADY AS VS PERCENT: 1 %

## 2019-01-05 ENCOUNTER — Telehealth: Payer: Self-pay | Admitting: *Deleted

## 2019-01-05 NOTE — Telephone Encounter (Signed)
-----   Message from Thurmon Fair, MD sent at 01/02/2019 10:36 AM EST ----- Dig level is still high. I think we should stop the medication altogether

## 2019-01-05 NOTE — Telephone Encounter (Signed)
Patient made aware of results and verbalized understanding.  Digoxin has been taken off of his medication list.

## 2019-01-12 NOTE — Telephone Encounter (Signed)
Script faxed on 12/08/18.

## 2019-01-12 NOTE — Telephone Encounter (Signed)
Scripts faxed on 12/08/18.

## 2019-01-15 ENCOUNTER — Other Ambulatory Visit: Payer: Self-pay | Admitting: Cardiovascular Disease

## 2019-01-15 MED ORDER — SPIRONOLACTONE 25 MG PO TABS: 12.5000 mg | ORAL_TABLET | Freq: Every day | ORAL | 3 refills | Status: AC

## 2019-01-15 NOTE — Telephone Encounter (Signed)
°*  STAT* If patient is at the pharmacy, call can be transferred to refill team.   1. Which medications need to be refilled? (please list name of each medication and dose if known) spirolactone 25 mg  2. Which pharmacy/location (including street and city if local pharmacy) is medication to be sent to? [facility] [phone]  3. Do they need a 30 day or 90 day supply? 90

## 2019-01-15 NOTE — Telephone Encounter (Signed)
RX sent to pharmacy  

## 2019-01-16 LAB — CUP PACEART REMOTE DEVICE CHECK
Battery Impedance: 444 Ohm
Battery Remaining Longevity: 88 mo
Brady Statistic AP VP Percent: 2 %
Brady Statistic AP VS Percent: 85 %
Brady Statistic AS VP Percent: 0 %
Brady Statistic AS VS Percent: 13 %
Date Time Interrogation Session: 20191213180641
Implantable Lead Implant Date: 20070730
Implantable Lead Location: 753859
Implantable Lead Location: 753860
Implantable Lead Model: 4092
Implantable Lead Model: 5076
Lead Channel Impedance Value: 599 Ohm
Lead Channel Pacing Threshold Amplitude: 1 V
Lead Channel Pacing Threshold Amplitude: 1.125 V
Lead Channel Pacing Threshold Pulse Width: 0.4 ms
Lead Channel Pacing Threshold Pulse Width: 0.4 ms
Lead Channel Setting Pacing Amplitude: 2 V
Lead Channel Setting Sensing Sensitivity: 4 mV
MDC IDC LEAD IMPLANT DT: 20070730
MDC IDC MSMT BATTERY VOLTAGE: 2.78 V
MDC IDC MSMT LEADCHNL RA IMPEDANCE VALUE: 373 Ohm
MDC IDC PG IMPLANT DT: 20140306
MDC IDC SET LEADCHNL RV PACING AMPLITUDE: 2.5 V
MDC IDC SET LEADCHNL RV PACING PULSEWIDTH: 0.4 ms

## 2019-02-04 ENCOUNTER — Ambulatory Visit (INDEPENDENT_AMBULATORY_CARE_PROVIDER_SITE_OTHER): Payer: Medicare Other

## 2019-02-04 DIAGNOSIS — I495 Sick sinus syndrome: Secondary | ICD-10-CM

## 2019-02-05 LAB — CUP PACEART REMOTE DEVICE CHECK
Battery Remaining Longevity: 84 mo
Battery Voltage: 2.78 V
Brady Statistic AP VP Percent: 3 %
Brady Statistic AS VP Percent: 0 %
Brady Statistic AS VS Percent: 6 %
Date Time Interrogation Session: 20200213153221
Implantable Lead Implant Date: 20070730
Implantable Lead Location: 753859
Implantable Lead Location: 753860
Implantable Lead Model: 4092
Implantable Pulse Generator Implant Date: 20140306
Lead Channel Impedance Value: 342 Ohm
Lead Channel Impedance Value: 512 Ohm
Lead Channel Pacing Threshold Amplitude: 1 V
Lead Channel Pacing Threshold Amplitude: 1.125 V
Lead Channel Pacing Threshold Pulse Width: 0.4 ms
Lead Channel Pacing Threshold Pulse Width: 0.4 ms
Lead Channel Setting Pacing Amplitude: 2 V
Lead Channel Setting Pacing Amplitude: 2.5 V
Lead Channel Setting Pacing Pulse Width: 0.4 ms
Lead Channel Setting Sensing Sensitivity: 4 mV
MDC IDC LEAD IMPLANT DT: 20070730
MDC IDC MSMT BATTERY IMPEDANCE: 469 Ohm
MDC IDC STAT BRADY AP VS PERCENT: 91 %

## 2019-02-16 NOTE — Progress Notes (Signed)
Remote pacemaker transmission.   

## 2019-02-26 ENCOUNTER — Encounter: Payer: Self-pay | Admitting: Cardiovascular Disease

## 2019-02-26 ENCOUNTER — Ambulatory Visit (INDEPENDENT_AMBULATORY_CARE_PROVIDER_SITE_OTHER): Payer: Medicare Other | Admitting: Cardiovascular Disease

## 2019-02-26 VITALS — BP 110/60 | HR 72 | Ht 63.0 in | Wt 138.0 lb

## 2019-02-26 DIAGNOSIS — I6521 Occlusion and stenosis of right carotid artery: Secondary | ICD-10-CM | POA: Diagnosis not present

## 2019-02-26 DIAGNOSIS — I495 Sick sinus syndrome: Secondary | ICD-10-CM

## 2019-02-26 DIAGNOSIS — I25708 Atherosclerosis of coronary artery bypass graft(s), unspecified, with other forms of angina pectoris: Secondary | ICD-10-CM

## 2019-02-26 DIAGNOSIS — Z952 Presence of prosthetic heart valve: Secondary | ICD-10-CM

## 2019-02-26 DIAGNOSIS — I5189 Other ill-defined heart diseases: Secondary | ICD-10-CM

## 2019-02-26 DIAGNOSIS — Z7901 Long term (current) use of anticoagulants: Secondary | ICD-10-CM | POA: Diagnosis not present

## 2019-02-26 DIAGNOSIS — I472 Ventricular tachycardia: Secondary | ICD-10-CM | POA: Diagnosis not present

## 2019-02-26 DIAGNOSIS — I4819 Other persistent atrial fibrillation: Secondary | ICD-10-CM | POA: Diagnosis not present

## 2019-02-26 DIAGNOSIS — I519 Heart disease, unspecified: Secondary | ICD-10-CM

## 2019-02-26 DIAGNOSIS — Z950 Presence of cardiac pacemaker: Secondary | ICD-10-CM

## 2019-02-26 DIAGNOSIS — Z531 Procedure and treatment not carried out because of patient's decision for reasons of belief and group pressure: Secondary | ICD-10-CM

## 2019-02-26 DIAGNOSIS — I4729 Other ventricular tachycardia: Secondary | ICD-10-CM

## 2019-02-26 NOTE — Progress Notes (Signed)
Patient ID: [name], male   DOB: 1924/03/29, 85 y.o.   MRN: [id number]    Cardiology Office Note    Date:  02/27/2019   ID:  [name], DOB August 16, 1924, MRN [id number]  PCP:  Romeo Rabon, MD  Cardiologist:   Thurmon Fair, MD   Chief Complaint  Patient presents with  . Coronary Artery Disease  . Atrial Flutter    History of Present Illness:  [name] is a 85 y.o. male with a long-standing history of coronary, valvular and arrhythmic cardiac problems.   He underwent elective electrical cardioversion on October 12, 2018 with successful return to atrial paced rhythm.  Since then he has had only one meaningful episode of arrhythmia, approximately 1 week episode of persistent atrial fibrillation on November 22-29, but this was asymptomatic and resolve spontaneously.  He had a lot of trouble with increased prevalence of atrial arrhythmia last fall, associated with persistent tachycardia and angina pectoris.  He had a 1 week episode of persistent atrial fibrillation in November, but since then has been arrhythmia free.  Tried to digoxin, but he had persistently elevated digoxin levels even on tiny doses of this medication was stopped.  He may not tolerate higher doses of beta-blocker due to his blood pressure.  Thankfully, he is now feeling well from a cardiovascular point of view.  Unfortunately he has a lot of problems with bilateral hip pain which limit his ability to move.  His wife has encouraged him to take acetaminophen more frequently, but he only accepts to take it at bedtime since he is afraid of liver complications.  He has tolerated anticoagulation without any bleeding problems, but long-term anticoagulation has always been a concern. He is prone to falls and he is a Jehovah's witness that refuses transfusions.  He had a follow-up echocardiogram in October of 2019.  It showed similar evidence of previous inferior wall scar with borderline EF of 50-55% and a  normally functioning aortic valve prosthesis the peak and mean gradients were excellent at 10 and 5 mm Hg respectively.   Presenting rhythm today is atrial paced, ventricular sensed.  He has 95% atrial pacing since his last device check.  He only has roughly 3% ventricular pacing.  The burden of atrial fibrillation is 7% but this is entirely represented by a 1 week episode in November.  None recorded since then. His current pacemaker generator (Medtronic Adapta) implanted in 2014 has another 7 years of longevity. Both leads were placed in 2007 and have excellent parameters. His device has periodically recorded NSVT, sometimes lengthy (almost 30 beats), never symptomatic.  No episodes of VT are seen since his last office visit.  He sees Dr. Arlyce Dice at the [facility]. 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  He has a long-standing history of multiple cardiac problems. In 2004 he underwent multivessel bypass surgery (LIMA to LAD, SVG to diagonal, SVG to circumflex, SVG to RCA) and received a 21 mm pericardal aortic valve prosthesis, by Dr. Donata Clay. 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 He had postoperative atrial fibrillation. In 2007 he had right coronary artery graft disease that required stenting with 3 consecutive Cypher drug-eluting stents all 3.5 in diameter, cumulative length of 79 mm. The last echo evaluation of his prosthetic valve was in October of 2019.  It showed similar evidence of previous inferior wall scar with borderline EF of 50-55% and a  normally functioning aortic valve prosthesis the peak and mean gradients were excellent at 10 and 5 mm Hg respectively.  In 2004, he received a dual-chamber permanent pacemaker for sinus bradycardia. He received a new generator Radiation protection practitioner(Medtronic Adapta) in March of 2014.  He has treated HTN and hyperlipidemia on Zetia (statin intolerant due to myalgia). Carotid ultrasonography performed  November 2017 showed no change from 2016 (60-79% right internal carotid stenosis, 1-39%left internal carotid artery stenosis). Repeat carotid ultrasound performed at another institution in 2019 shows bilateral mild plaque, less than 49% stenosis.  Past Medical History:  Diagnosis Date  . Anemia, chronic disease   . Aortic stenosis 2004   tissue AVR  . CAD (coronary artery disease) 2004   CABG X 4  . History of nephrolithiasis   . HTN (hypertension)   . Pacemaker 2007, March 2013   MDT  . RBBB   . Refusal of blood transfusions as patient is Jehovah's Witness     Outpatient Medications Prior to Visit  Medication Sig Dispense Refill  . acetaminophen (TYLENOL) 650 MG CR tablet Take 650-1,300 mg by mouth 2 (two) times daily as needed for pain.     Marland Kitchen. apixaban (ELIQUIS) 2.5 MG TABS tablet Take 1 tablet (2.5 mg total) by mouth 2 (two) times daily. 60 tablet 11  . Carboxymethylcellul-Glycerin (LUBRICATING EYE DROPS OP) Place 1-2 drops into both eyes 3 (three) times daily.    . dorzolamide (TRUSOPT) 2 % ophthalmic solution Place 1 drop into both eyes 3 (three) times daily.    Marland Kitchen. erythromycin ophthalmic ointment Place 1 application into both eyes at bedtime.   3  . escitalopram (LEXAPRO) 10 MG tablet Take 5 mg by mouth daily.     Marland Kitchen. ezetimibe (ZETIA) 10 MG tablet Take 1 tablet (10 mg total) by mouth daily. 90 tablet 3  . hydrochlorothiazide (HYDRODIURIL) 25 MG tablet Take 12.5 mg by mouth every other day.     . latanoprost (XALATAN) 0.005 % ophthalmic solution Place 1 drop into the right eye at bedtime.    Marland Kitchen. loratadine (CLARITIN) 10 MG tablet Take 10 mg by mouth daily.    . metFORMIN (GLUCOPHAGE) 500 MG tablet Take 500 mg by mouth daily with supper.     . metoprolol tartrate (LOPRESSOR) 100 MG tablet Take 1 tablet (100 mg total) by mouth 2 (two) times daily. 180 tablet 1  . omeprazole (PRILOSEC) 20 MG capsule Take 20 mg by mouth every morning.    . senna (SENOKOT) 8.6 MG TABS tablet Take 1 tablet  by mouth at bedtime.     Marland Kitchen. spironolactone (ALDACTONE) 25 MG tablet Take 0.5 tablets (12.5 mg total) by mouth daily. 45 tablet 3  . tamsulosin (FLOMAX) 0.4 MG CAPS capsule     . Vitamin D, Ergocalciferol, (DRISDOL) 1.25 MG (50000 UT) CAPS capsule Take 50,000 Units by mouth every 7 (seven) days. Every tuesday    . fluticasone (FLONASE) 50 MCG/ACT nasal spray Place 1 spray into both nostrils 2 (two) times daily.    . Lidocaine (ASPERCREME LIDOCAINE) 4 % PTCH Apply 1 patch topically at bedtime.     No facility-administered medications prior to visit.      Allergies:   Sular [nisoldipine er]; Bactrim [sulfamethoxazole-trimethoprim]; Celebrex [celecoxib]; Hytrin [terazosin]; Indocin [indomethacin]; Lopid [gemfibrozil]; Niacin and related; Norvasc [amlodipine besylate]; Penicillins; Procardia [nifedipine]; Tekturna [aliskiren]; Triamterene; Vasotec [enalapril maleate]; Amiodarone; Keflex [cephalexin]; Lescol [fluvastatin]; Nisoldipine; Pravastatin; Statins; and Zocor [simvastatin]   Social History   Socioeconomic History  . Marital status:  Married    Spouse name: Not on file  . Number of children: Not on file  . Years of education: Not on file  . Highest education level: Not on file  Occupational History  . Occupation: retired    Comment: Personnel officer  Social Needs  . Financial resource strain: Not on file  . Food insecurity:    Worry: Not on file    Inability: Not on file  . Transportation needs:    Medical: Not on file    Non-medical: Not on file  Tobacco Use  . Smoking status: Former Smoker    Last attempt to quit: 10/21/1950    Years since quitting: 68.4  . Smokeless tobacco: Never Used  Substance and Sexual Activity  . Alcohol use: Not on file  . Drug use: Not on file  . Sexual activity: Not on file  Lifestyle  . Physical activity:    Days per week: Not on file    Minutes per session: Not on file  . Stress: Not on file  Relationships  . Social connections:    Talks on  phone: Not on file    Gets together: Not on file    Attends religious service: Not on file    Active member of club or organization: Not on file    Attends meetings of clubs or organizations: Not on file    Relationship status: Not on file  Other Topics Concern  . Not on file  Social History Narrative  . Not on file     Family History:  The patient's family history includes Cancer in his mother; Heart disease in his brother, father, and mother; Pneumonia in his sister.   ROS:   Please see the history of present illness.    ROS All other systems are reviewed and are negative   PHYSICAL EXAM:   VS:  BP 110/60   Pulse 72   Ht  (1.6 m)   Wt 138 lb (62.6 kg)   BMI 24.45 kg/m     General: Alert, oriented x3, no distress, healthy left subclavian pacemaker site.  He appears very elderly and frail Head: no evidence of trauma, PERRL, EOMI, no exophtalmos or lid lag, no myxedema, no xanthelasma; normal ears, nose and oropharynx Neck: normal jugular venous pulsations and no hepatojugular reflux; brisk carotid pulses without delay and no carotid bruits Chest: clear to auscultation, no signs of consolidation by percussion or palpation, normal fremitus, symmetrical and full respiratory excursions Cardiovascular: normal position and quality of the apical impulse, regular rhythm, normal first and widely split second heart sounds, 2/6 aortic ejection murmur, no diastolic murmurs, rubs or gallops Abdomen: no tenderness or distention, no masses by palpation, no abnormal pulsatility or arterial bruits, normal bowel sounds, no hepatosplenomegaly Extremities: no clubbing, cyanosis or edema; 2+ radial, ulnar and brachial pulses bilaterally; 2+ right femoral, posterior tibial and dorsalis pedis pulses; 2+ left femoral, posterior tibial and dorsalis pedis pulses; no subclavian or femoral bruits Neurological: grossly nonfocal.  Very slow shuffling gait with a walker Psych: Normal mood and  affect    Wt Readings from Last 3 Encounters:  02/26/19 138 lb (62.6 kg)  09/21/18 143 lb (64.9 kg)  09/17/18 144 lb (65.3 kg)      Studies/Labs Reviewed:   EKG: Ordered today, shows atrial paced, ventricular sensed rhythm with old right bundle branch block and inverted T waves in the inferior and anterolateral leads, Q waves in leads III and aVF  Recent  Labs: 10/06/2018: Hemoglobin 10.1; Platelets 161 12/07/2018: BUN 24; Creatinine, Ser 1.28; Potassium 4.3; Sodium 139   Lipid Panel    Component Value Date/Time   CHOL  04/09/2008 0458    152        ATP III CLASSIFICATION:  <200     mg/dL   Desirable  505-397  mg/dL   Borderline High  >=673    mg/dL   High   TRIG 80 41/93/7902 0458   HDL 27 (L) 04/09/2008 0458   CHOLHDL 5.6 04/09/2008 0458   VLDL 16 04/09/2008 0458   LDLCALC (H) 04/09/2008 0458    109        Total Cholesterol/HDL:CHD Risk Coronary Heart Disease Risk Table                     Men   Women  1/2 Average Risk   3.4   3.3     ASSESSMENT:    1. Other persistent atrial fibrillation   2. SSS (sick sinus syndrome) (HCC)   3. NSVT (nonsustained ventricular tachycardia) (HCC)   4. Long term (current) use of anticoagulants   5. Pacemaker   6. Coronary artery disease of bypass graft of native heart with stable angina pectoris (HCC)   7. S/P AVR (aortic valve replacement)   8. Echocardiography shows left ventricular diastolic dysfunction   9. Carotid stenosis, right   10. Refusal of blood transfusions as patient is Jehovah's Witness      PLAN:  In order of problems listed above:  1. AFlutter/AFib: This was symptomatic and very difficult to rate control but responded well to cardioversion.  Since his cardioversion he had an episode of atrial fibrillation that was asymptomatic and not associated with tachycardia.  Continue treatment with beta-blockers.  Continue anticoagulation at least for the near future 2. SSS: As before, unless he is in atrial  tachyarrhythmia he has virtually 100% atrial pacing 3. NSVT: None recorded since the last device check. Past episodes were occasionally quite lengthy. On beta blocker. 4. Anticoagulation:  He has chronic anemia and is a Jehovah's Witness, consequently very concerned about the use of long-term anticoagulants. Despite high estimated embolic risk (CHADSVasc 4 - age 59, CAD, HTN), he has never had a stroke or transient ischemic attack.  He had an episode of atrial fibrillation about a month after his cardioversion.  This is likely to keep happening again.  I think at least for the time being we will continue anticoagulants.  Encouraged him to try to take acetaminophen for severe pain so that we can avoid nonsteroidal anti-inflammatory drugs, which would have increased bleeding risk 5. PPM: Normal device function.  Remote downloads every 3 months and yearly office follow-up 6. CAD s/p CABG: He had angina when he was tachycardic but this resolved with rate control/rhythm control.  Invasive evaluation is not appropriate in this elderly patient with poor functional status. 7. S/p AVR: Normal prosthetic function by recent echo October 2019.  8. Diastolic dysfunction by echo, without clinical heart failure.  His arthritis problems and limited activity are probably masking any symptoms of heart failure. 9. Right carotid stenosis: Moderate RICA stenosis, stable in 60-79% range for several consecutive tests.  Unless he has neurological complaints, I doubt we will have him consider revascularization, so serial studies are not appropriate.  Clopidogrel was stopped when he started anticoagulation with Eliquis. 10. Jehovah's Witness refusing blood products    Medication Adjustments/Labs and Tests Ordered: Current medicines are reviewed at length  with the patient today.  Concerns regarding medicines are outlined above.  Medication changes, Labs and Tests ordered today are listed in the Patient Instructions below. Patient  Instructions  Medication Instructions:  Continue all medications Ok to take Extra Strength Tylenol 2 tablets in morning and 2 tablets in pm if needed  If you need a refill on your cardiac medications before your next appointment, please call your pharmacy.   Lab work: None ordered   Testing/Procedures: None ordered  Follow-Up: At [facility], you and your health needs are our priority.  As part of our continuing mission to provide you with exceptional heart care, we have created designated Provider Care Teams.  These Care Teams include your primary Cardiologist (physician) and Advanced Practice Providers (APPs -  Physician Assistants and Nurse Practitioners) who all work together to provide you with the care you need, when you need it. . Follow up appointment with Dr.Kaesyn Johnston in 12 months  Call 3 months before to schedule        Signed, Thurmon Fair, MD  02/27/2019 10:16 AM    [facility] [address] [place], [place], [place]  [address] Phone: [phone]; Fax: [phone]

## 2019-02-26 NOTE — Patient Instructions (Signed)
Medication Instructions:  Continue all medications Ok to take Extra Strength Tylenol 2 tablets in morning and 2 tablets in pm if needed  If you need a refill on your cardiac medications before your next appointment, please call your pharmacy.   Lab work: None ordered   Testing/Procedures: None ordered  Follow-Up: At [facility], you and your health needs are our priority.  As part of our continuing mission to provide you with exceptional heart care, we have created designated Provider Care Teams.  These Care Teams include your primary Cardiologist (physician) and Advanced Practice Providers (APPs -  Physician Assistants and Nurse Practitioners) who all work together to provide you with the care you need, when you need it. . Follow up appointment with Dr.Croitoru in 12 months  Call 3 months before to schedule

## 2019-02-27 DIAGNOSIS — I519 Heart disease, unspecified: Secondary | ICD-10-CM | POA: Insufficient documentation

## 2019-02-27 DIAGNOSIS — Z7901 Long term (current) use of anticoagulants: Secondary | ICD-10-CM | POA: Insufficient documentation

## 2019-02-27 DIAGNOSIS — I5189 Other ill-defined heart diseases: Secondary | ICD-10-CM | POA: Insufficient documentation

## 2019-02-27 DIAGNOSIS — I4819 Other persistent atrial fibrillation: Secondary | ICD-10-CM | POA: Insufficient documentation

## 2019-02-27 DIAGNOSIS — I25708 Atherosclerosis of coronary artery bypass graft(s), unspecified, with other forms of angina pectoris: Secondary | ICD-10-CM | POA: Insufficient documentation

## 2019-03-03 LAB — CUP PACEART INCLINIC DEVICE CHECK
Date Time Interrogation Session: 20200311164046
Implantable Lead Implant Date: 20070730
Implantable Lead Implant Date: 20070730
Implantable Lead Location: 753859
Implantable Lead Location: 753860
Implantable Lead Model: 4092
Implantable Lead Model: 5076
Implantable Pulse Generator Implant Date: 20140306

## 2019-05-06 ENCOUNTER — Ambulatory Visit (INDEPENDENT_AMBULATORY_CARE_PROVIDER_SITE_OTHER): Payer: Medicare Other | Admitting: *Deleted

## 2019-05-06 ENCOUNTER — Other Ambulatory Visit: Payer: Self-pay

## 2019-05-06 DIAGNOSIS — I495 Sick sinus syndrome: Secondary | ICD-10-CM | POA: Diagnosis not present

## 2019-05-06 LAB — CUP PACEART REMOTE DEVICE CHECK
Battery Impedance: 493 Ohm
Battery Remaining Longevity: 83 mo
Battery Voltage: 2.78 V
Brady Statistic AP VP Percent: 2 %
Brady Statistic AP VS Percent: 97 %
Brady Statistic AS VP Percent: 0 %
Brady Statistic AS VS Percent: 0 %
Date Time Interrogation Session: 20200514154622
Implantable Lead Implant Date: 20070730
Implantable Lead Implant Date: 20070730
Implantable Lead Location: 753859
Implantable Lead Location: 753860
Implantable Lead Model: 4092
Implantable Lead Model: 5076
Implantable Pulse Generator Implant Date: 20140306
Lead Channel Impedance Value: 338 Ohm
Lead Channel Impedance Value: 533 Ohm
Lead Channel Pacing Threshold Amplitude: 0.875 V
Lead Channel Pacing Threshold Amplitude: 1.125 V
Lead Channel Pacing Threshold Pulse Width: 0.4 ms
Lead Channel Pacing Threshold Pulse Width: 0.4 ms
Lead Channel Setting Pacing Amplitude: 1.875
Lead Channel Setting Pacing Amplitude: 2.5 V
Lead Channel Setting Pacing Pulse Width: 0.4 ms
Lead Channel Setting Sensing Sensitivity: 4 mV

## 2019-05-07 ENCOUNTER — Telehealth: Payer: Self-pay | Admitting: Cardiovascular Disease

## 2019-05-07 NOTE — Telephone Encounter (Signed)
Received a call this evening from Ms. Cerrone regarding her husband, [name].   She reports that her husband had a LLE angioplasty 2 weeks prior, but continued to have LE numbness and pain. They follow up with his PCP who prescribed Lyrica 50mg  twice a day which he started this morning.   At 8PM, Drexel reported suddenly onset chest heaviness and discomfort that was radiating to his jaw. BP 142/107 HR 73. On repeat, peaked at 168/84 with HR 70. Most recently 138/76 HR 71. Chest pain resolved within 5 minutes. Patient reports he has never experienced similar chest heaviness, but does report jaw pain with previous MI. Patient now asymptomatic.   I counseled wife that, given his extensive history of CAD and atrial arrhythmias, and episode of chest pain I would advise presenting to a local urgent care or ER for evaluation.   Patient's wife in agreement. Will notify Dr. Royann Shivers.   Lauren K. Charna Busman, MD

## 2019-05-11 ENCOUNTER — Encounter: Payer: Self-pay | Admitting: Cardiology

## 2019-05-11 NOTE — Progress Notes (Signed)
Remote pacemaker transmission.   

## 2019-05-12 NOTE — Telephone Encounter (Signed)
I called and spoke with his wife. No further episodes of chest pain. He refused to go to ED. Does not sound like it was a coronary problem, could have been a medication side effect (he has stopped that new med). No change in cardiac plan. MCr 

## 2019-05-17 ENCOUNTER — Other Ambulatory Visit: Payer: Self-pay | Admitting: Cardiovascular Disease

## 2019-08-05 ENCOUNTER — Ambulatory Visit (INDEPENDENT_AMBULATORY_CARE_PROVIDER_SITE_OTHER): Payer: Medicare Other | Admitting: *Deleted

## 2019-08-05 DIAGNOSIS — I495 Sick sinus syndrome: Secondary | ICD-10-CM | POA: Diagnosis not present

## 2019-08-05 LAB — CUP PACEART REMOTE DEVICE CHECK
Battery Impedance: 568 Ohm
Battery Remaining Longevity: 79 mo
Battery Voltage: 2.78 V
Brady Statistic AP VP Percent: 3 %
Brady Statistic AP VS Percent: 82 %
Brady Statistic AS VP Percent: 0 %
Brady Statistic AS VS Percent: 15 %
Date Time Interrogation Session: 20200813140257
Implantable Lead Implant Date: 20070730
Implantable Lead Implant Date: 20070730
Implantable Lead Location: 753859
Implantable Lead Location: 753860
Implantable Lead Model: 4092
Implantable Lead Model: 5076
Implantable Pulse Generator Implant Date: 20140306
Lead Channel Impedance Value: 368 Ohm
Lead Channel Impedance Value: 531 Ohm
Lead Channel Pacing Threshold Amplitude: 1 V
Lead Channel Pacing Threshold Amplitude: 1.125 V
Lead Channel Pacing Threshold Pulse Width: 0.4 ms
Lead Channel Pacing Threshold Pulse Width: 0.4 ms
Lead Channel Sensing Intrinsic Amplitude: 11.2 mV
Lead Channel Setting Pacing Amplitude: 2 V
Lead Channel Setting Pacing Amplitude: 2.5 V
Lead Channel Setting Pacing Pulse Width: 0.4 ms
Lead Channel Setting Sensing Sensitivity: 4 mV

## 2019-08-13 ENCOUNTER — Encounter: Payer: Self-pay | Admitting: Cardiology

## 2019-08-13 NOTE — Progress Notes (Signed)
Remote pacemaker transmission.   

## 2019-11-04 ENCOUNTER — Ambulatory Visit (INDEPENDENT_AMBULATORY_CARE_PROVIDER_SITE_OTHER): Payer: Medicare Other | Admitting: *Deleted

## 2019-11-04 DIAGNOSIS — I495 Sick sinus syndrome: Secondary | ICD-10-CM

## 2019-11-04 DIAGNOSIS — I4819 Other persistent atrial fibrillation: Secondary | ICD-10-CM

## 2019-11-04 LAB — CUP PACEART REMOTE DEVICE CHECK
Battery Impedance: 592 Ohm
Battery Remaining Longevity: 81 mo
Battery Voltage: 2.78 V
Brady Statistic AP VP Percent: 2 %
Brady Statistic AP VS Percent: 49 %
Brady Statistic AS VP Percent: 0 %
Brady Statistic AS VS Percent: 49 %
Date Time Interrogation Session: 20201112140938
Implantable Lead Implant Date: 20070730
Implantable Lead Implant Date: 20070730
Implantable Lead Location: 753859
Implantable Lead Location: 753860
Implantable Lead Model: 4092
Implantable Lead Model: 5076
Implantable Pulse Generator Implant Date: 20140306
Lead Channel Impedance Value: 373 Ohm
Lead Channel Impedance Value: 497 Ohm
Lead Channel Pacing Threshold Amplitude: 1 V
Lead Channel Pacing Threshold Amplitude: 1.125 V
Lead Channel Pacing Threshold Pulse Width: 0.4 ms
Lead Channel Pacing Threshold Pulse Width: 0.4 ms
Lead Channel Setting Pacing Amplitude: 2 V
Lead Channel Setting Pacing Amplitude: 2.5 V
Lead Channel Setting Pacing Pulse Width: 0.4 ms
Lead Channel Setting Sensing Sensitivity: 4 mV

## 2019-11-25 ENCOUNTER — Telehealth: Payer: Self-pay | Admitting: Cardiovascular Disease

## 2019-11-25 NOTE — Telephone Encounter (Signed)
LM2CB forwarded last 2 OV Dr Sallyanne Kuster progress notes to fax number indicated for [place] [facility].

## 2019-11-25 NOTE — Telephone Encounter (Signed)
[name] from [facility] called for the last few office notes as patient currently doesn't qualify for hospice.  [name] can be reach at:[phone] Fax: [phone]

## 2019-11-25 NOTE — Progress Notes (Signed)
Remote pacemaker transmission.   

## 2020-02-03 ENCOUNTER — Ambulatory Visit (INDEPENDENT_AMBULATORY_CARE_PROVIDER_SITE_OTHER): Payer: Medicare Other | Admitting: *Deleted

## 2020-02-03 DIAGNOSIS — I495 Sick sinus syndrome: Secondary | ICD-10-CM | POA: Diagnosis not present

## 2020-02-03 LAB — CUP PACEART REMOTE DEVICE CHECK
Battery Impedance: 667 Ohm
Battery Remaining Longevity: 79 mo
Battery Voltage: 2.78 V
Brady Statistic AP VP Percent: 2 %
Brady Statistic AP VS Percent: 35 %
Brady Statistic AS VP Percent: 0 %
Brady Statistic AS VS Percent: 64 %
Date Time Interrogation Session: 20210211112000
Implantable Lead Implant Date: 20070730
Implantable Lead Implant Date: 20070730
Implantable Lead Location: 753859
Implantable Lead Location: 753860
Implantable Lead Model: 4092
Implantable Lead Model: 5076
Implantable Pulse Generator Implant Date: 20140306
Lead Channel Impedance Value: 388 Ohm
Lead Channel Impedance Value: 504 Ohm
Lead Channel Pacing Threshold Amplitude: 1 V
Lead Channel Pacing Threshold Amplitude: 1.125 V
Lead Channel Pacing Threshold Pulse Width: 0.4 ms
Lead Channel Pacing Threshold Pulse Width: 0.4 ms
Lead Channel Setting Pacing Amplitude: 2 V
Lead Channel Setting Pacing Amplitude: 2.5 V
Lead Channel Setting Pacing Pulse Width: 0.4 ms
Lead Channel Setting Sensing Sensitivity: 4 mV

## 2020-02-03 NOTE — Progress Notes (Signed)
PPM Remote  

## 2020-02-08 ENCOUNTER — Telehealth: Payer: Self-pay | Admitting: Cardiovascular Disease

## 2020-02-08 NOTE — Telephone Encounter (Signed)
New Message:      Wife would like for you to give her a call. She wants to tell you what is going on with the pt and discuss his appt. He have to be transported to all of his appt, had a terrible fall this past summer.

## 2020-02-08 NOTE — Telephone Encounter (Signed)
The patient took a bad fall in June. He is now wheelchair bound and in a hospital bed. She asked that the patient have a virtual visit. She has been advised that this is okay. The patient has a virtual appointment on 03/22/2020

## 2020-02-22 ENCOUNTER — Other Ambulatory Visit: Payer: Self-pay | Admitting: Cardiovascular Disease

## 2020-03-22 ENCOUNTER — Telehealth: Payer: Medicare Other | Admitting: Cardiovascular Disease

## 2020-03-23 DEATH — deceased
